# Patient Record
Sex: Female | Born: 1980 | Race: Black or African American | Hispanic: No | Marital: Single | Smoking: Never smoker
Health system: Southern US, Community
[De-identification: ages and names within clinical notes are randomized; demographics above are authoritative.]

## PROBLEM LIST (undated history)

## (undated) ENCOUNTER — Inpatient Hospital Stay (HOSPITAL_COMMUNITY): Payer: Self-pay

## (undated) DIAGNOSIS — T7840XA Allergy, unspecified, initial encounter: Secondary | ICD-10-CM

## (undated) DIAGNOSIS — J302 Other seasonal allergic rhinitis: Secondary | ICD-10-CM

## (undated) HISTORY — DX: Other seasonal allergic rhinitis: J30.2

## (undated) HISTORY — PX: FOOT SURGERY: SHX648

## (undated) HISTORY — DX: Allergy, unspecified, initial encounter: T78.40XA

---
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
## 2005-01-10 ENCOUNTER — Emergency Department (HOSPITAL_COMMUNITY): Admission: EM | Admit: 2005-01-10 | Discharge: 2005-01-10 | Payer: Self-pay | Admitting: *Deleted

## 2007-05-13 ENCOUNTER — Other Ambulatory Visit: Admission: RE | Admit: 2007-05-13 | Discharge: 2007-05-13 | Payer: Self-pay | Admitting: Obstetrics and Gynecology

## 2007-05-24 ENCOUNTER — Encounter: Admission: RE | Admit: 2007-05-24 | Discharge: 2007-05-24 | Payer: Self-pay | Admitting: Obstetrics & Gynecology

## 2010-08-25 ENCOUNTER — Encounter: Payer: Self-pay | Admitting: Obstetrics & Gynecology

## 2011-11-14 ENCOUNTER — Ambulatory Visit: Payer: BC Managed Care – PPO | Admitting: Internal Medicine

## 2011-11-14 VITALS — BP 123/76 | HR 86 | Temp 98.6°F | Resp 18 | Ht 65.75 in | Wt 205.8 lb

## 2011-11-14 DIAGNOSIS — J301 Allergic rhinitis due to pollen: Secondary | ICD-10-CM

## 2011-11-14 DIAGNOSIS — J209 Acute bronchitis, unspecified: Secondary | ICD-10-CM

## 2011-11-14 DIAGNOSIS — J9801 Acute bronchospasm: Secondary | ICD-10-CM

## 2011-11-14 MED ORDER — HYDROCODONE-HOMATROPINE 5-1.5 MG/5ML PO SYRP: 5.0000 mL | ORAL_SOLUTION | Freq: Three times a day (TID) | ORAL | Status: AC | PRN

## 2011-11-14 MED ORDER — AZITHROMYCIN 500 MG PO TABS: 500.0000 mg | ORAL_TABLET | Freq: Every day | ORAL | Status: AC

## 2011-11-14 MED ORDER — PREDNISONE 20 MG PO TABS: ORAL_TABLET | ORAL | Status: DC

## 2011-11-14 MED ORDER — FLUTICASONE PROPIONATE 50 MCG/ACT NA SUSP: 2.0000 | Freq: Every day | NASAL | Status: DC

## 2011-11-14 MED ORDER — CETIRIZINE HCL 10 MG PO TABS: 10.0000 mg | ORAL_TABLET | Freq: Every day | ORAL | Status: DC

## 2011-11-16 NOTE — Progress Notes (Signed)
  Subjective:    Patient ID: Leah Tate, female    DOB: 05/24/1981, 31 y.o.   MRN: [id number]  HPIHere with a recent history of allergic symptoms had increased to the point that she has a cough that is productive with shortness of breath when active/she has a past history of reactive airway disease without the diagnosis of asthma She has a history of allergic rhinitis    Review of SystemsThere is no fever or night sweats    Objective:   Physical Exam Vital signs are stable Conjunctiva are injected TMs clear Nares boggy Throat clear No a.c. Nodes Lungs with wheezing on forced expiration bilaterally/ mild       Assessment & Plan:  Problem #1 allergic rhinitis Problem #2 bronchitis with reactive airway disease  Treatment with Zithromax, Zyrtec, Flonase, Hycodan, and prednisone as in meds and orders

## 2012-11-28 ENCOUNTER — Ambulatory Visit: Payer: BC Managed Care – PPO | Admitting: Physician Assistant

## 2012-11-28 VITALS — BP 130/75 | HR 66 | Temp 98.5°F | Resp 16 | Ht 66.0 in | Wt 223.0 lb

## 2012-11-28 DIAGNOSIS — J309 Allergic rhinitis, unspecified: Secondary | ICD-10-CM

## 2012-11-28 DIAGNOSIS — J029 Acute pharyngitis, unspecified: Secondary | ICD-10-CM

## 2012-11-28 DIAGNOSIS — R05 Cough: Secondary | ICD-10-CM

## 2012-11-28 DIAGNOSIS — J302 Other seasonal allergic rhinitis: Secondary | ICD-10-CM | POA: Insufficient documentation

## 2012-11-28 MED ORDER — CETIRIZINE HCL 10 MG PO TABS: 10.0000 mg | ORAL_TABLET | Freq: Every day | ORAL | Status: DC

## 2012-11-28 MED ORDER — FLUTICASONE PROPIONATE 50 MCG/ACT NA SUSP: 2.0000 | Freq: Every day | NASAL | Status: DC

## 2012-11-28 MED ORDER — HYDROCODONE-ACETAMINOPHEN 7.5-325 MG/15ML PO SOLN: 5.0000 mL | Freq: Four times a day (QID) | ORAL | Status: DC | PRN

## 2012-11-28 MED ORDER — PREDNISONE 20 MG PO TABS: ORAL_TABLET | ORAL | Status: DC

## 2012-11-28 NOTE — Patient Instructions (Signed)
Get plenty of rest and drink at least 64 ounces of water daily. 

## 2012-11-28 NOTE — Progress Notes (Signed)
  Subjective:    Patient ID: Leah Tate, female    DOB: 1981/03/18, 32 y.o.   MRN: [id number]  HPI This 32 y.o. female presents for evaluation of seasonal allergies.  Sneezing, itchy eyes, sore throat and cough (productive of clear sputum). Nasal drainage is clear.  Some nausea and vomited x 1.    No fever, chills, unexplained myalgias or arthralgias .  Not currently taking anything for her symptoms, reporting that when she's tried OTC products over the years, they've not helped at all.  Has run out of Zyrtec and Flonase. Requests the same prescriptions as she got last year. The prednisone really helped, as did the cough medication.  Review of Systems As above.    Objective:   Physical Exam  Blood pressure 130/75, pulse 66, temperature 98.5 F (36.9 C), temperature source Oral, resp. rate 16, height 5\' 6"  (1.676 m), weight 223 lb (101.152 kg), last menstrual period 11/19/2012, SpO2 99.00%. Body mass index is 36.01 kg/(m^2). Well-developed, well nourished BF who is awake, alert and oriented, in NAD. HEENT: [place]/AT, PERRL, EOMI.  Sclera and conjunctiva are clear.  EAC are patent, TMs are normal in appearance. Nasal mucosa is pink and moist. OP is clear. Neck: supple, non-tender, no lymphadenopathy, thyromegaly. Heart: RRR, no murmur Lungs: normal effort, CTA Abdomen: normo-active bowel sounds, supple, non-tender, no mass or organomegaly. Extremities: no cyanosis, clubbing or edema. Skin: warm and dry without rash. Psychologic: good mood and appropriate affect, normal speech and behavior.     Assessment & Plan:  Seasonal allergic rhinitis - Plan: cetirizine (ZYRTEC) 10 MG tablet, fluticasone (FLONASE) 50 MCG/ACT nasal spray, predniSONE (DELTASONE) 20 MG tablet  Cough - Plan: HYDROcodone-acetaminophen (HYCET) 7.5-325 mg/15 ml solution  Acute pharyngitis - Plan: HYDROcodone-acetaminophen (HYCET) 7.5-325 mg/15 ml solution  Fernande Bras, PA-C Physician Assistant-Certified [facility]

## 2014-01-08 ENCOUNTER — Inpatient Hospital Stay (HOSPITAL_COMMUNITY): Payer: Self-pay

## 2014-01-08 ENCOUNTER — Encounter (HOSPITAL_COMMUNITY): Payer: Self-pay | Admitting: *Deleted

## 2014-01-08 ENCOUNTER — Inpatient Hospital Stay (HOSPITAL_COMMUNITY)
Admission: AD | Admit: 2014-01-08 | Discharge: 2014-01-08 | Disposition: A | Discharge status: Home / Self Care | Payer: Self-pay | Source: Ambulatory Visit | Attending: Obstetrics & Gynecology | Admitting: Obstetrics & Gynecology

## 2014-01-08 DIAGNOSIS — A5901 Trichomonal vulvovaginitis: Secondary | ICD-10-CM | POA: Insufficient documentation

## 2014-01-08 DIAGNOSIS — O23591 Infection of other part of genital tract in pregnancy, first trimester: Secondary | ICD-10-CM

## 2014-01-08 DIAGNOSIS — R109 Unspecified abdominal pain: Secondary | ICD-10-CM | POA: Insufficient documentation

## 2014-01-08 DIAGNOSIS — D649 Anemia, unspecified: Secondary | ICD-10-CM | POA: Insufficient documentation

## 2014-01-08 DIAGNOSIS — O98819 Other maternal infectious and parasitic diseases complicating pregnancy, unspecified trimester: Secondary | ICD-10-CM | POA: Insufficient documentation

## 2014-01-08 DIAGNOSIS — O200 Threatened abortion: Secondary | ICD-10-CM

## 2014-01-08 LAB — URINALYSIS, ROUTINE W REFLEX MICROSCOPIC
BILIRUBIN URINE: NEGATIVE
Glucose, UA: NEGATIVE mg/dL
Hgb urine dipstick: NEGATIVE
KETONES UR: 15 mg/dL — AB
NITRITE: NEGATIVE
PROTEIN: NEGATIVE mg/dL
Specific Gravity, Urine: >1.03 — ABNORMAL HIGH (ref 1.005–1.030)
Urobilinogen, UA: 0.2 mg/dL (ref 0.0–1.0)
pH: 6 (ref 5.0–8.0)

## 2014-01-08 LAB — URINE MICROSCOPIC-ADD ON

## 2014-01-08 LAB — WET PREP, GENITAL: Yeast Wet Prep HPF POC: NONE SEEN

## 2014-01-08 LAB — CBC
HCT: 31 % — ABNORMAL LOW (ref 36.0–46.0)
HEMOGLOBIN: 9.9 g/dL — AB (ref 12.0–15.0)
MCH: 27 pg (ref 26.0–34.0)
MCHC: 31.9 g/dL (ref 30.0–36.0)
MCV: 84.5 fL (ref 78.0–100.0)
PLATELETS: 332 10*3/uL (ref 150–400)
RBC: 3.67 MIL/uL — AB (ref 3.87–5.11)
RDW: 14.3 % (ref 11.5–15.5)
WBC: 8.7 10*3/uL (ref 4.0–10.5)

## 2014-01-08 LAB — HCG, QUANTITATIVE, PREGNANCY: hCG, Beta Chain, Quant, S: 6408 m[IU]/mL — ABNORMAL HIGH (ref <5)

## 2014-01-08 LAB — ABO/RH: ABO/RH(D): O POS

## 2014-01-08 LAB — POCT PREGNANCY, URINE: PREG TEST UR: POSITIVE — AB

## 2014-01-08 MED ORDER — METRONIDAZOLE 500 MG PO TABS
2000.0000 mg | ORAL_TABLET | Freq: Once | ORAL | Status: AC
  Administered 2014-01-08: 2000 mg via ORAL
  Filled 2014-01-08: qty 4

## 2014-01-08 MED ORDER — ACETAMINOPHEN 325 MG PO TABS
650.0000 mg | ORAL_TABLET | Freq: Once | ORAL | Status: AC
  Administered 2014-01-08: 650 mg via ORAL
  Filled 2014-01-08: qty 2

## 2014-01-08 NOTE — MAU Note (Signed)
Pt presents to MAU with complaints of lower abdominal cramping for two days. HPT + test last Sunday. Denies any vaginal bleeding

## 2014-01-08 NOTE — Discharge Instructions (Signed)
Anemia, Nonspecific Anemia is a condition in which the concentration of red blood cells or hemoglobin in the blood is below normal. Hemoglobin is a substance in red blood cells that carries oxygen to the tissues of the body. Anemia results in not enough oxygen reaching these tissues.  CAUSES  Common causes of anemia include:   Excessive bleeding. Bleeding may be internal or external. This includes excessive bleeding from periods (in women) or from the intestine.   Poor nutrition.   Chronic kidney, thyroid, and liver disease.  Bone marrow disorders that decrease red blood cell production.  Cancer and treatments for cancer.  HIV, AIDS, and their treatments.  Spleen problems that increase red blood cell destruction.  Blood disorders.  Excess destruction of red blood cells due to infection, medicines, and autoimmune disorders. SIGNS AND SYMPTOMS   Minor weakness.   Dizziness.   Headache.  Palpitations.   Shortness of breath, especially with exercise.   Paleness.  Cold sensitivity.  Indigestion.  Nausea.  Difficulty sleeping.  Difficulty concentrating. Symptoms may occur suddenly or they may develop slowly.  DIAGNOSIS  Additional blood tests are often needed. These help your health care provider determine the best treatment. Your health care provider will check your stool for blood and look for other causes of blood loss.  TREATMENT  Treatment varies depending on the cause of the anemia. Treatment can include:   Supplements of iron, vitamin B12, or folic acid.   Hormone medicines.   A blood transfusion. This may be needed if blood loss is severe.   Hospitalization. This may be needed if there is significant continual blood loss.   Dietary changes.  Spleen removal. HOME CARE INSTRUCTIONS Keep all follow-up appointments. It often takes many weeks to correct anemia, and having your health care provider check on your condition and your response to  treatment is very important. SEEK IMMEDIATE MEDICAL CARE IF:   You develop extreme weakness, shortness of breath, or chest pain.   You become dizzy or have trouble concentrating.  You develop heavy vaginal bleeding.   You develop a rash.   You have bloody or black, tarry stools.   You faint.   You vomit up blood.   You vomit repeatedly.   You have abdominal pain.  You have a fever or persistent symptoms for more than 2 3 days.   You have a fever and your symptoms suddenly get worse.   You are dehydrated.  MAKE SURE YOU:  Understand these instructions.  Will watch your condition.  Will get help right away if you are not doing well or get worse. Document Released: 08/28/2004 Document Revised: 03/23/2013 Document Reviewed: 01/14/2013 [facility] Patient Information 2014 [place], [place]. Threatened Miscarriage Bleeding during the first 20 weeks of pregnancy is common. This is sometimes called a threatened miscarriage. This is a pregnancy that is threatening to end before the twentieth week of pregnancy. Often this bleeding stops with bed rest or decreased activities as suggested by your caregiver and the pregnancy continues without any more problems. You may be asked to not have sexual intercourse, have orgasms or use tampons until further notice. Sometimes a threatened miscarriage can progress to a complete or incomplete miscarriage. This may or may not require further treatment. Some miscarriages occur before a woman misses a menstrual period and knows she is pregnant. Miscarriages occur in 15 to 20% of all pregnancies and usually occur during the first 13 weeks of the pregnancy. The exact cause of a miscarriage is usually  never known. A miscarriage is natures way of ending a pregnancy that is abnormal or would not make it to term. There are some things that may put you at risk to have a miscarriage, such as:  Hormone problems.  Infection of the uterus or  cervix.  Chronic illness, diabetes for example, especially if it is not controlled.  Abnormal shaped uterus.  Fibroids in the uterus.  Incompetent cervix (the cervix is too weak to hold the baby).  Smoking.  Drinking too much alcohol. It's best not to drink any alcohol when you are pregnant.  Taking illegal drugs. TREATMENT  When a miscarriage becomes complete and all products of conception (all the tissue in the uterus) have been passed, often no treatment is needed. If you think you passed tissue, save it in a container and take it to your doctor for evaluation. If the miscarriage is incomplete (parts of the fetus or placenta remain in the uterus), further treatment may be needed. The most common reason for further treatment is continued bleeding (hemorrhage) because pregnancy tissue did not pass out of the uterus. This often occurs if a miscarriage is incomplete. Tissue left behind may also become infected. Treatment usually is dilatation and curettage (the removal of the remaining products of pregnancy. This can be done by a simple sucking procedure (suction curettage) or a simple scraping of the inside of the uterus. This may be done in the hospital or in the caregiver's office. This is only done when your caregiver knows that there is no chance for the pregnancy to proceed to term. This is determined by physical examination, negative pregnancy test, falling pregnancy hormone count and/or, an ultrasound revealing a dead fetus. Miscarriages are often a very emotional time for prospective mothers and fathers. This is not you or your partners fault. It did not occur because of an inadequacy in you or your partner. Nearly all miscarriages occur because the pregnancy has started off wrongly. At least half of these pregnancies have a chromosomal abnormality. It is almost always not inherited. Others may have developmental problems with the fetus or placenta. This does not always show up even when the  products miscarried are studied under the microscope. The miscarriage is nearly always not your fault and it is not likely that you could have prevented it from happening. If you are having emotional and grieving problems, talk to your health care provider and even seek counseling, if necessary, before getting pregnant again. You can begin trying for another pregnancy as soon as your caregiver says it is OK. HOME CARE INSTRUCTIONS   Your caregiver may order bed rest depending on how much bleeding and cramping you are having. You may be limited to only getting up to go to the bathroom. You may be allowed to continue light activity. You may need to make arrangements for the care of your other children and for any other responsibilities.  Keep track of the number of pads you use each day, how often you have to change pads and how saturated (soaked) they are. Record this information.  DO NOT USE TAMPONS. Do not douche, have sexual intercourse or orgasms until approved by your caregiver.  You may receive a follow up appointment for re-evaluation of your pregnancy and a repeat blood test. Re-evaluation often occurs after 2 days and again in 4 to 6 weeks. It is very important that you follow-up in the recommended time period.  If you are Rh negative and the father is Rh positive  or you do not know the fathers' blood type, you may receive a shot (Rh immune globulin) to help prevent abnormal antibodies that can develop and affect the baby in any future pregnancies. SEEK IMMEDIATE MEDICAL CARE IF:  You have severe cramps in your stomach, back, or abdomen.  You have a sudden onset of severe pain in the lower part of your abdomen.  You develop chills.  You run an unexplained temperature of 101 F (38.3 C) or higher.  You pass large clots or tissue. Save any tissue for your caregiver to inspect.  Your bleeding increases or you become light-headed, weak, or have fainting episodes.  You have a gush of  fluid from your vagina.  You pass out. This could mean you have a tubal (ectopic) pregnancy. Document Released: 07/21/2005 Document Revised: 10/13/2011 Document Reviewed: 03/06/2008 [facility] Patient Information 2014 [place], [place]. Trichomonas Test This is a test used to diagnose an infection with Trichomonas vaginalis. This is a sexually transmitted, microscopic parasite that causes vaginal infections in women and urethritis in some men. This test is often done if there is vaginal discharge or pain on urination. If you have an infection with another sexually transmitted disease, your caregiver might test for trichomonas as well. Secretions or a sample are collected on a swab and are examined under a microscope or cultured to detect the presence of Trichomonas vaginalis. Trichomonas is one of the most common sexually transmitted diseases. An infected person is at greater risk of getting other sexually transmitted diseases, so your caregiver may want to test for these other infections also.  Trichomonas infection can affect pregnancy, contributing to premature birth and low birth weight. You should inform your physician if you may be pregnant. The doctor may medically manage a woman who is infected and in her first three months of pregnancy differently.  SPECIMEN COLLECTION In women, a swab of secretions is collected from the vagina. In men, a swab is inserted into the urethra. MEANING OF TEST  A positive test indicates an active infection that requires treatment with antibiotics. It is usually treated with an antibiotic called metronidazole. All current sexual partners must be treated at the same time or the patient is likely to become re-infected. The Center for Disease Control (CDC) recommends the following guidelines to avoid infection or reinfection:   Abstain from sexual intercourse  Use a latex condom properly, every time you have sexual intercourse, with every partner.  Limit your sexual  partners. The more sex partners you have, the greater your risk of encountering someone who has this or other STI's.  If you are infected, your sexual partner(s) should be treated. This will prevent you from getting reinfected. OBTAINING THE TEST RESULTS It is your responsibility to obtain your test results. Ask the lab or department performing the test when and how you will get your results. Document Released: 08/23/2004 Document Revised: 11/15/2012 Document Reviewed: 04/30/2005 [facility] Patient Information 2014 [place], [place].

## 2014-01-08 NOTE — MAU Provider Note (Signed)
History     CSN: [id number]  Arrival date and time: 01/08/14 1435   First Provider Initiated Contact with Patient 01/08/14 1510      Chief Complaint  Patient presents with  . Abdominal Cramping  . Possible Pregnancy   HPI Comments: Leah Tate 33 y.o. G2P0010 [redacted]w[redacted]d presents to MAU with pelvic pain and positive home preg test. She has been having cramp like pain for 2 weeks. Its a 7/10. No meds taken. No vaginal bleeding, some vaginal discharge. No partner change.  Abdominal Cramping  Possible Pregnancy Associated symptoms include abdominal pain.      Past Medical History  Diagnosis Date  . Allergic rhinitis, seasonal     [place]    Past Surgical History  Procedure Laterality Date  . Foot surgery Right     arch support    Family History  Problem Relation Age of Onset  . Hypertension Father   . Diabetes Father     History  Substance Use Topics  . Smoking status: Never Smoker   . Smokeless tobacco: Never Used  . Alcohol Use: No    Allergies: No Known Allergies  No prescriptions prior to admission    Review of Systems  Constitutional: Negative.   HENT: Negative.   Eyes: Negative.   Respiratory: Negative.   Cardiovascular: Negative.   Gastrointestinal: Positive for abdominal pain.  Genitourinary:       Vaginal discharge  Musculoskeletal: Negative.   Skin: Negative.   Neurological: Negative.   Psychiatric/Behavioral: Negative.    Physical Exam   Blood pressure 108/77, pulse 79, temperature 98.7 F (37.1 C), resp. rate 16, last menstrual period 12/02/2013.  Physical Exam  Constitutional: She is oriented to person, place, and time. She appears well-developed and well-nourished. No distress.  HENT:  Head: Normocephalic and atraumatic.  GI: Soft. She exhibits no distension. There is no tenderness. There is no rebound and no guarding.  Genitourinary:  Genital: external negative Vaginal: small amount white, odorous discharge Cervix: closed/  thick Bimanual: nontender   Musculoskeletal: Normal range of motion.  Neurological: She is alert and oriented to person, place, and time.  Skin: Skin is warm and dry.  Psychiatric: She has a normal mood and affect. Her behavior is normal. Judgment and thought content normal.   Results for orders placed during the hospital encounter of 01/08/14 (from the past 24 hour(s))  URINALYSIS, ROUTINE W REFLEX MICROSCOPIC     Status: Abnormal   Collection Time    01/08/14  2:50 PM      Result Value Ref Range   Color, Urine YELLOW  YELLOW   APPearance CLEAR  CLEAR   Specific Gravity, Urine >1.030 (*) 1.005 - 1.030   pH 6.0  5.0 - 8.0   Glucose, UA NEGATIVE  NEGATIVE mg/dL   Hgb urine dipstick NEGATIVE  NEGATIVE   Bilirubin Urine NEGATIVE  NEGATIVE   Ketones, ur 15 (*) NEGATIVE mg/dL   Protein, ur NEGATIVE  NEGATIVE mg/dL   Urobilinogen, UA 0.2  0.0 - 1.0 mg/dL   Nitrite NEGATIVE  NEGATIVE   Leukocytes, UA SMALL (*) NEGATIVE  URINE MICROSCOPIC-ADD ON     Status: Abnormal   Collection Time    01/08/14  2:50 PM      Result Value Ref Range   Squamous Epithelial / LPF MANY (*) RARE   WBC, UA 11-20  <3 WBC/hpf   RBC / HPF 3-6  <3 RBC/hpf   Bacteria, UA MANY (*) RARE   Urine-Other TRICHOMONAS  PRESENT    POCT PREGNANCY, URINE     Status: Abnormal   Collection Time    01/08/14  2:53 PM      Result Value Ref Range   Preg Test, Ur POSITIVE (*) NEGATIVE  WET PREP, GENITAL     Status: Abnormal   Collection Time    01/08/14  3:18 PM      Result Value Ref Range   Yeast Wet Prep HPF POC NONE SEEN  NONE SEEN   Trich, Wet Prep FEW (*) NONE SEEN   Clue Cells Wet Prep HPF POC FEW (*) NONE SEEN   WBC, Wet Prep HPF POC FEW (*) NONE SEEN  HCG, QUANTITATIVE, PREGNANCY     Status: Abnormal   Collection Time    01/08/14  3:28 PM      Result Value Ref Range   hCG, Beta Chain, Quant, S 6408 (*) <5 mIU/mL  CBC     Status: Abnormal   Collection Time    01/08/14  3:28 PM      Result Value Ref Range    WBC 8.7  4.0 - 10.5 K/uL   RBC 3.67 (*) 3.87 - 5.11 MIL/uL   Hemoglobin 9.9 (*) 12.0 - 15.0 g/dL   HCT 16.1 (*) 09.6 - 04.5 %   MCV 84.5  78.0 - 100.0 fL   MCH 27.0  26.0 - 34.0 pg   MCHC 31.9  30.0 - 36.0 g/dL   RDW 40.9  81.1 - 91.4 %   Platelets 332  150 - 400 K/uL  ABO/RH     Status: None   Collection Time    01/08/14  3:28 PM      Result Value Ref Range   ABO/RH(D) O POS     US Ob Comp Less 14 Wks  01/08/2014   CLINICAL DATA:  Pelvic cramping  EXAM: OBSTETRIC <14 WK [place] AND TRANSVAGINAL OB US  TECHNIQUE: Study was performed transabdominally to optimize pelvic field of view evaluation and transvaginally to optimize internal visceral architecture evaluation.  COMPARISON:  None.  FINDINGS: Intrauterine gestational sac: Visualized/normal in shape. A single sac is seen.  Yolk sac:  Not seen  Embryo:  Not seen  MSD:  8  mm   5 w   3  d  [place] EDC: September 07, 2014  Maternal uterus/adnexae: Within the maternal uterus, there is a hypoechoic mass in the fundus measuring 1.9 x 1.9 x 1.3 cm. No subchorionic hemorrhage seen. Maternal left ovary could not be visualized. The right maternal ovary appears unremarkable. There is no free pelvic fluid.  IMPRESSION: A single gestational sac is seen within the uterus. Fetal pole not seen at this time. Probable early intrauterine gestational sac, but no yolk sac, fetal pole, or cardiac activity yet visualized. Recommend follow-up quantitative B-HCG levels and follow-up US in 14 days to confirm and assess viability. This recommendation follows SRU consensus guidelines: Diagnostic Criteria for Nonviable Pregnancy Early in the First Trimester. Malva Limes Med 2013; 782:9562-13.  There is a small intrauterine leiomyoma. Maternal left ovary could not be visualized. There is no extrauterine maternal pelvic mass or fluid seen.   Electronically Signed   By: Bretta Bang M.D.   On: 01/08/2014 16:46   US Ob Transvaginal  01/08/2014   CLINICAL DATA:  Pelvic cramping  EXAM:  OBSTETRIC <14 WK [place] AND TRANSVAGINAL OB US  TECHNIQUE: Study was performed transabdominally to optimize pelvic field of view evaluation and transvaginally to optimize internal visceral architecture  evaluation.  COMPARISON:  None.  FINDINGS: Intrauterine gestational sac: Visualized/normal in shape. A single sac is seen.  Yolk sac:  Not seen  Embryo:  Not seen  MSD:  8  mm   5 w   3  d  US EDC: September 07, 2014  Maternal uterus/adnexae: Within the maternal uterus, there is a hypoechoic mass in the fundus measuring 1.9 x 1.9 x 1.3 cm. No subchorionic hemorrhage seen. Maternal left ovary could not be visualized. The right maternal ovary appears unremarkable. There is no free pelvic fluid.  IMPRESSION: A single gestational sac is seen within the uterus. Fetal pole not seen at this time. Probable early intrauterine gestational sac, but no yolk sac, fetal pole, or cardiac activity yet visualized. Recommend follow-up quantitative B-HCG levels and follow-up US in 14 days to confirm and assess viability. This recommendation follows SRU consensus guidelines: Diagnostic Criteria for Nonviable Pregnancy Early in the First Trimester. Malva Limes Engl J Med 2013; 846:9629-52; 369:1443-51.  There is a small intrauterine leiomyoma. Maternal left ovary could not be visualized. There is no extrauterine maternal pelvic mass or fluid seen.   Electronically Signed   By: Bretta BangWilliam  Woodruff M.D.   On: 01/08/2014 16:46    MAU Course  Procedures  MDM Wet prep, GC, Chlamydia, CBC, UA, U/S, ABORh, Quant Tylenol/ she is driving  Assessment and Plan   A: Pain in early pregnancy Trichomonas Anemia  P: Flagyl 2 Grams po now Miscarriage precautions Repeat Quant in 2 days Advised to start PNV today Advised to increase iron in diet [facility] Trichomonas  Doralee AlbinoLinda M Tali Coster 01/08/2014, 3:24 PM

## 2014-01-09 LAB — GC/CHLAMYDIA PROBE AMP
CT Probe RNA: NEGATIVE
GC Probe RNA: NEGATIVE

## 2014-01-09 LAB — URINE CULTURE: Colony Count: 30000

## 2014-01-10 NOTE — Progress Notes (Addendum)
Urine culture results: Consulted with Delores (PharmD) regarding urine results and what to treat patient with.  Advised to consider doing a cath UA to rule out contaminants.  If repeat + for DIPHTHEROIDS(CORYNEBACTERIUM SPECIES), treat with amp and gent.   Pt called and notified, unable to come to [facility] during day due to job.  Will come to [facility]U on Thursday for cath UA.  Does not need to see provider.

## 2014-06-05 ENCOUNTER — Encounter (HOSPITAL_COMMUNITY): Payer: Self-pay | Admitting: *Deleted

## 2014-11-13 ENCOUNTER — Encounter (HOSPITAL_COMMUNITY): Payer: Self-pay | Admitting: *Deleted

## 2015-01-04 ENCOUNTER — Ambulatory Visit (INDEPENDENT_AMBULATORY_CARE_PROVIDER_SITE_OTHER): Payer: BLUE CROSS/BLUE SHIELD | Admitting: Physician Assistant

## 2015-01-04 VITALS — BP 122/70 | HR 88 | Temp 99.1°F | Resp 18 | Ht 66.0 in | Wt 250.0 lb

## 2015-01-04 DIAGNOSIS — J029 Acute pharyngitis, unspecified: Secondary | ICD-10-CM | POA: Diagnosis not present

## 2015-01-04 DIAGNOSIS — F329 Major depressive disorder, single episode, unspecified: Secondary | ICD-10-CM

## 2015-01-04 DIAGNOSIS — R635 Abnormal weight gain: Secondary | ICD-10-CM

## 2015-01-04 DIAGNOSIS — F32A Depression, unspecified: Secondary | ICD-10-CM

## 2015-01-04 DIAGNOSIS — R059 Cough, unspecified: Secondary | ICD-10-CM

## 2015-01-04 DIAGNOSIS — R05 Cough: Secondary | ICD-10-CM | POA: Diagnosis not present

## 2015-01-04 LAB — POCT CBC
Granulocyte percent: 75.8 %G (ref 37–80)
HEMATOCRIT: 32.4 % — AB (ref 37.7–47.9)
HEMOGLOBIN: 10 g/dL — AB (ref 12.2–16.2)
Lymph, poc: 2 (ref 0.6–3.4)
MCH, POC: 25.4 pg — AB (ref 27–31.2)
MCHC: 30.9 g/dL — AB (ref 31.8–35.4)
MCV: 82 fL (ref 80–97)
MID (CBC): 0.5 (ref 0–0.9)
MPV: 6.6 fL (ref 0–99.8)
POC GRANULOCYTE: 7.9 — AB (ref 2–6.9)
POC LYMPH %: 19 % (ref 10–50)
POC MID %: 5.2 % (ref 0–12)
Platelet Count, POC: 365 10*3/uL (ref 142–424)
RBC: 3.95 M/uL — AB (ref 4.04–5.48)
RDW, POC: 15.1 %
WBC: 10.4 10*3/uL — AB (ref 4.6–10.2)

## 2015-01-04 LAB — POCT RAPID STREP A (OFFICE): Rapid Strep A Screen: NEGATIVE

## 2015-01-04 MED ORDER — HYDROCOD POLST-CPM POLST ER 10-8 MG/5ML PO SUER: 5.0000 mL | Freq: Two times a day (BID) | ORAL | Status: DC | PRN

## 2015-01-04 MED ORDER — MAGIC MOUTHWASH W/LIDOCAINE: 10.0000 mL | ORAL | Status: DC | PRN

## 2015-01-04 MED ORDER — AZITHROMYCIN 250 MG PO TABS: ORAL_TABLET | ORAL | Status: DC

## 2015-01-04 NOTE — Progress Notes (Signed)
[facility] [address], [place] [address] [phone]  Date:  01/04/2015   Name:  Leah Tate   DOB:  1980-12-26   MRN:  [id number]  PCP:  No PCP Per Patient    Chief Complaint: Cough; Sore Throat; and Nasal Congestion   History of Present Illness:  This is a 34 y.o. female with PMH allergic rhinitis who is presenting with 1 week of cough, sore throat and nasal congestion. Reports 2 days prior to URI symptoms she had a "stomach bug" and had vomiting and diarrhea. Diarrhea is better but still vomiting at least once a day. Vomiting now is more d/t post-tussive emesis. Having sore throat and states it's painful to swallow. Cough is dry. She's having constant headache located to upper forehead. Having post-nasal drip. Has problems with allergies in March/April but hasn't had problems since then. Been taking theraflu and zycam and not working. Not taking an allergy pill. Denies fever, chills, SOB, wheezing  Pt notes that she has had depressed mood since high school. It has been worse over the past 2 years d/t situational stressors. A romantic relationship ended 1 year ago after she got pregnant and had an abortion. She has never been on any medications. She states she took care of her father in high school and he was on a lot of medications which makes her wary of taking anything long-term. She has tried therapy before and was helpful but she is not in a place financially for therapy now. She works as a Haematologistbank teller. She aspires to go to beauty school and [place] She states she wants to move and "start over". She denies SI/HI. She does not exercise but states in the past when her mood has been better she loved exercising. She has gained 45 pounds in the past 3 years.  Review of Systems:  Review of Systems  Constitutional: Negative for fever and chills.  HENT: Positive for congestion, sore throat and voice change. Negative for ear pain.   Eyes: Negative for  redness.  Respiratory: Positive for cough. Negative for shortness of breath and wheezing.   Cardiovascular: Negative for chest pain.  Gastrointestinal: Positive for nausea and vomiting. Negative for abdominal pain.  Skin: Negative for rash.  Allergic/Immunologic: Positive for environmental allergies.  Neurological: Positive for headaches.  Hematological: Negative for adenopathy.  Psychiatric/Behavioral: Positive for sleep disturbance.    Patient Active Problem List   Diagnosis Date Noted  . Seasonal allergic rhinitis 11/28/2012    Prior to Admission medications   Not on File    No Known Allergies  Past Surgical History  Procedure Laterality Date  . Foot surgery Right     arch support    History  Substance Use Topics  . Smoking status: Never Smoker   . Smokeless tobacco: Never Used  . Alcohol Use: No    Family History  Problem Relation Age of Onset  . Hypertension Father   . Diabetes Father     Medication list has been reviewed and updated.  Physical Examination:  Physical Exam  Constitutional: She is oriented to person, place, and time. She appears well-developed and well-nourished. No distress.  HENT:  Head: Normocephalic and atraumatic.  Right Ear: Hearing, tympanic membrane, external ear and ear canal normal.  Left Ear: Hearing, tympanic membrane, external ear and ear canal normal.  Nose: Mucosal edema present.  Mouth/Throat: Uvula is midline and mucous membranes are normal. Posterior oropharyngeal edema (bilateral tonsils 2-3+)  present. No oropharyngeal exudate, posterior oropharyngeal erythema or tonsillar abscesses.  Eyes: Conjunctivae and lids are normal. Right eye exhibits no discharge. Left eye exhibits no discharge. No scleral icterus.  Cardiovascular: Normal rate, regular rhythm, normal heart sounds and normal pulses.   No murmur heard. Pulmonary/Chest: Effort normal and breath sounds normal. No respiratory distress. She has no wheezes. She has no  rhonchi. She has no rales.  Abdominal: Soft. Normal appearance. There is no tenderness.  Musculoskeletal: Normal range of motion.  Lymphadenopathy:       Head (right side): No submental, no submandibular and no tonsillar adenopathy present.       Head (left side): No submental, no submandibular and no tonsillar adenopathy present.    She has no cervical adenopathy.  Neurological: She is alert and oriented to person, place, and time.  Skin: Skin is warm, dry and intact. No lesion and no rash noted.  Psychiatric: She has a normal mood and affect. Her speech is normal and behavior is normal. Thought content normal.   BP 122/70 mmHg  Pulse 88  Temp(Src) 99.1 F (37.3 C) (Oral)  Resp 18  Ht  (1.676 m)  Wt 250 lb (113.399 kg)  BMI 40.37 kg/m2  SpO2 98%  LMP 12/18/2014  Results for orders placed or performed in visit on 01/04/15  POCT rapid strep A  Result Value Ref Range   Rapid Strep A Screen Negative Negative  POCT CBC  Result Value Ref Range   WBC 10.4 (A) 4.6 - 10.2 K/uL   Lymph, poc 2.0 0.6 - 3.4   POC LYMPH PERCENT 19.0 10 - 50 %L   MID (cbc) 0.5 0 - 0.9   POC MID % 5.2 0 - 12 %M   POC Granulocyte 7.9 (A) 2 - 6.9   Granulocyte percent 75.8 37 - 80 %G   RBC 3.95 (A) 4.04 - 5.48 M/uL   Hemoglobin 10.0 (A) 12.2 - 16.2 g/dL   HCT, POC 04.5 (A) 40.9 - 47.9 %   MCV 82.0 80 - 97 fL   MCH, POC 25.4 (A) 27 - 31.2 pg   MCHC 30.9 (A) 31.8 - 35.4 g/dL   RDW, POC 81.1 %   Platelet Count, POC 365.0 142 - 424 K/uL   MPV 6.6 0 - 99.8 fL   Assessment and Plan:  1. Cough 2. Sore throat 3. Acute pharyngitis CBC with wbc slightly above normal and mild left shift. Rapid strep negative, culture pending. Zpak prescribed. Mouthwash and cough syrup for symptoms. She will return if symptoms not improving in 7-10 days. - POCT CBC - POCT rapid strep A - Alum & Mag Hydroxide-Simeth (MAGIC MOUTHWASH W/LIDOCAINE) SOLN; Take 10 mLs by mouth every 2 (two) hours as needed for mouth pain.   Dispense: 360 mL; Refill: 0 - Culture, Group A Strep - azithromycin (ZITHROMAX) 250 MG tablet; Take 2 tabs PO x 1 dose, then 1 tab PO QD x 4 days  Dispense: 6 tablet; Refill: 0 - chlorpheniramine-HYDROcodone (TUSSIONEX PENNKINETIC ER) 10-8 MG/5ML SUER; Take 5 mLs by mouth every 12 (twelve) hours as needed for cough.  Dispense: 100 mL; Refill: 0 - Culture, Group A Strep  4. Depression 5. Weight gain Pt has had depressed mood for over a decade and never taken anything since she is wary of medication. Depression has been worse than normal for the past 2 years. She has gained 45 pounds in the past 3 years d/t lack of motivation for exercise. She is not  having SI/HI. Pt does not want to start anything at this time but I strongly encouraged her to think about it. Advised restarting exercise for weight loss and mood purposes. - TSH   [place] Miners. Dyke Brackett, MHS [facility]  01/04/2015

## 2015-01-04 NOTE — Patient Instructions (Signed)
Take antibiotic until finished. Use cough syrup at night for sleep. Use mouthwash for throat pain. If you are not getting better in 7-10 days, return to clinic. If your mood is not improving, let me know, we can start you on a medication. Start exercising - this will naturally improve your mood.

## 2015-01-05 LAB — CULTURE, GROUP A STREP: ORGANISM ID, BACTERIA: NORMAL

## 2015-01-05 LAB — TSH: TSH: 0.562 u[IU]/mL (ref 0.350–4.500)

## 2017-01-26 ENCOUNTER — Emergency Department (HOSPITAL_BASED_OUTPATIENT_CLINIC_OR_DEPARTMENT_OTHER): Payer: Self-pay

## 2017-01-26 ENCOUNTER — Encounter (HOSPITAL_BASED_OUTPATIENT_CLINIC_OR_DEPARTMENT_OTHER): Payer: Self-pay | Admitting: Emergency Medicine

## 2017-01-26 ENCOUNTER — Emergency Department (HOSPITAL_BASED_OUTPATIENT_CLINIC_OR_DEPARTMENT_OTHER)
Admission: EM | Admit: 2017-01-26 | Discharge: 2017-01-26 | Disposition: A | Discharge status: Home / Self Care | Payer: Self-pay | Attending: Emergency Medicine | Admitting: Emergency Medicine

## 2017-01-26 DIAGNOSIS — Y999 Unspecified external cause status: Secondary | ICD-10-CM | POA: Insufficient documentation

## 2017-01-26 DIAGNOSIS — R05 Cough: Secondary | ICD-10-CM | POA: Insufficient documentation

## 2017-01-26 DIAGNOSIS — Y929 Unspecified place or not applicable: Secondary | ICD-10-CM | POA: Insufficient documentation

## 2017-01-26 DIAGNOSIS — W1789XA Other fall from one level to another, initial encounter: Secondary | ICD-10-CM | POA: Insufficient documentation

## 2017-01-26 DIAGNOSIS — M25562 Pain in left knee: Secondary | ICD-10-CM | POA: Insufficient documentation

## 2017-01-26 DIAGNOSIS — Y9389 Activity, other specified: Secondary | ICD-10-CM | POA: Insufficient documentation

## 2017-01-26 DIAGNOSIS — R059 Cough, unspecified: Secondary | ICD-10-CM

## 2017-01-26 MED ORDER — ALBUTEROL SULFATE HFA 108 (90 BASE) MCG/ACT IN AERS
1.0000 | INHALATION_SPRAY | Freq: Once | RESPIRATORY_TRACT | Status: AC
  Administered 2017-01-26: 2 via RESPIRATORY_TRACT
  Filled 2017-01-26: qty 6.7

## 2017-01-26 MED ORDER — IBUPROFEN 800 MG PO TABS: 800.0000 mg | ORAL_TABLET | Freq: Three times a day (TID) | ORAL | 0 refills | Status: DC | PRN

## 2017-01-26 NOTE — ED Notes (Signed)
Patient transported to X-ray 

## 2017-01-26 NOTE — ED Triage Notes (Signed)
Pt has been having a non-productive cough for over one month.  Pt seen at urgent care x 2 but has not improved.  Pt fell Friday off a chair.  Pt having left knee pain.

## 2017-01-26 NOTE — Discharge Instructions (Signed)
It was my pleasure taking care of you today!   Ibuprofen as needed for knee pain. Rest and ice affected area for additional pain relief. Please follow-up with your regular doctor if needed pain does not improve over the next week. If you do not have a primary care provider, please see the resource guide attached to discharge summary.  Begin taking Claritin or Zyrtec daily for cough/allergies. Use inhaler every 4 hours as needed for cough. Follow up with primary care provider if symptoms are not improving in the next 2 weeks.  Return to the emergency department for new or worsening symptoms, any additional concerns.

## 2017-01-26 NOTE — ED Provider Notes (Signed)
[facility]   CSN: [id number] Arrival date & time: 01/26/17  0944     History   Chief Complaint Chief Complaint  Patient presents with  . Cough  . Leg Pain    HPI Leah Tate is a 36 y.o. female.  The history is provided by the patient and medical records. No language interpreter was used.  Cough   Leg Pain     Leah Tate is a 36 y.o. female  with a PMH of seasonal allergies who presents to the Emergency Department with two complaints:  1. Nonproductive cough 2 months. She was seen by urgent care at the end of April where she had a negative chest x-ray and was given [employer] Perles. These did not help. She has also tried Delsym over-the-counter as well as cough drops with no relief. She went back to the urgent care a second time where she was given a steroid dose pack. This improved her congestion and sinus pressure, however dry cough has persisted. She then went to the urgent care a third time where she was given an inhaler to use for cough. Unfortunately this was too expensive and she did not get it filled. She no longer is experiencing congestion or sore throat. No fever or chills. No trouble breathing or chest pain. No neck pain.  2. Acute onset of aching, throbbing left knee pain 2-3 days. Patient states that she got on a chair in order to kill a spider on the ceiling, when she fell from the stair, hitting the top of her knee. Patient states she has been able to ambulate, although with a limp. She has not tried any medication or treatment for her symptoms. No history of injuries to the left lower extremity in the past. No open wounds.  Past Medical History:  Diagnosis Date  . Allergic rhinitis, seasonal    Springtime  . Allergy     Patient Active Problem List   Diagnosis Date Noted  . Seasonal allergic rhinitis 11/28/2012    Past Surgical History:  Procedure Laterality Date  . FOOT SURGERY Right    arch support    OB History    Gravida Para Term Preterm AB Living   2 0 0 0 1 0   SAB TAB Ectopic Multiple Live Births   0 1 0 0         Home Medications    Prior to Admission medications   Medication Sig Start Date End Date Taking? Authorizing Provider  ibuprofen (ADVIL,MOTRIN) 800 MG tablet Take 1 tablet (800 mg total) by mouth every 8 (eight) hours as needed for moderate pain. 01/26/17   Saylor Murry, Chase PicketJaime Pilcher, PA-C    Family History Family History  Problem Relation Age of Onset  . Hypertension Father   . Diabetes Father     Social History Social History  Substance Use Topics  . Smoking status: Never Smoker  . Smokeless tobacco: Never Used  . Alcohol use No     Allergies   Patient has no known allergies.   Review of Systems Review of Systems  Respiratory: Positive for cough.   Musculoskeletal: Positive for arthralgias. Negative for back pain.  All other systems reviewed and are negative.    Physical Exam Updated Vital Signs BP (!) 126/94   Pulse 74   Temp 98.7 F (37.1 C) (Oral)   Resp 18   Ht 5\' 6"  (1.676 m)   Wt 117.9 kg (260 lb)   LMP  01/12/2017   SpO2 100%   BMI 41.97 kg/m   Physical Exam  Constitutional: She is oriented to person, place, and time. She appears well-developed and well-nourished. No distress.  HENT:  Head: Normocephalic and atraumatic.  OP with erythema, no exudates or tonsillar hypertrophy. No nasal mucosal edema. No focal areas of sinus tenderness.  Neck: Normal range of motion. Neck supple.  Cardiovascular: Normal rate, regular rhythm and normal heart sounds.   Pulmonary/Chest: Effort normal.  Lungs are clear to auscultation bilaterally - no w/r/r  Abdominal: Soft. She exhibits no distension. There is no tenderness.  Musculoskeletal:  Left knee with diffuse tenderness over anterior knee. Full ROM. No joint line tenderness. No joint effusion or swelling appreciated. No abnormal alignment or patellar mobility. No bruising, erythema or warmth overlaying the  joint. No varus/valgus laxity. Negative drawer's, Lachman's and McMurray's.  No crepitus. 2+ DP pulses bilaterally. All compartments are soft. Sensation intact distal to injury.  Neurological: She is alert and oriented to person, place, and time.  Skin: Skin is warm and dry. She is not diaphoretic.  Nursing note and vitals reviewed.    ED Treatments / Results  Labs (all labs ordered are listed, but only abnormal results are displayed) Labs Reviewed - No data to display  EKG  EKG Interpretation None       Radiology Dg Knee Complete 4 Views Left  Result Date: 01/26/2017 CLINICAL DATA:  Patient states that she caught her foot in a chair and hyperextended her left knee on 01/23/17, has left anterior knee pains, walking with a limp, no other complaints EXAM: LEFT KNEE - COMPLETE 4+ VIEW COMPARISON:  None. FINDINGS: No evidence of fracture, dislocation, or joint effusion. No evidence of arthropathy or other focal bone abnormality. Soft tissues are unremarkable. IMPRESSION: Negative. Electronically Signed   By: Bary Richard M.D.   On: 01/26/2017 11:20    Procedures Procedures (including critical care time)  Medications Ordered in ED Medications  albuterol (PROVENTIL HFA;VENTOLIN HFA) 108 (90 Base) MCG/ACT inhaler 1-2 puff (2 puffs Inhalation Given 01/26/17 1131)     Initial Impression / Assessment and Plan / ED Course  I have reviewed the triage vital signs and the nursing notes.  Pertinent labs & imaging results that were available during my care of the patient were reviewed by me and considered in my medical decision making (see chart for details).    Leah Tate is a 36 y.o. female who presents to ED for two complaints:  1. Cough x 2 months. Already had CXR at urgent care which was negative. On exam, patient is very well appearing, afebrile with clear lungs bilaterally. No coughing noted throughout examination. Do not feel repeating a chest x-ray would be of value. Associated  sore throat, congestion, sinus pressure have resolved. Likely residual cough from viral URI. She was given rx for inhaler to use as needed for cough at urgent care but was unable to get prescription filled due to cost. Inhaler provided here. PCP follow up if symptoms persist another week or two.   2. Left knee pain 2/2 mechanical fall. LLE NVI. X-ray negative. Symptomatic home care instructions discussed. PCP follow up in 1 week with no improvement. Ace wrap provided for comfort. Crutches offered but patient declined.   Return precautions discussed and all questions answered.    Final Clinical Impressions(s) / ED Diagnoses   Final diagnoses:  Cough  Acute pain of left knee    New Prescriptions New Prescriptions  IBUPROFEN (ADVIL,MOTRIN) 800 MG TABLET    Take 1 tablet (800 mg total) by mouth every 8 (eight) hours as needed for moderate pain.     Sadik Piascik, Chase Picket, PA-C 01/26/17 1200    Tegeler, Canary Brim, MD 01/26/17 [phone]

## 2018-01-09 ENCOUNTER — Encounter: Payer: Self-pay | Admitting: Physician Assistant

## 2018-01-09 ENCOUNTER — Ambulatory Visit (INDEPENDENT_AMBULATORY_CARE_PROVIDER_SITE_OTHER): Payer: 59 | Admitting: Physician Assistant

## 2018-01-09 VITALS — BP 134/81 | HR 66 | Temp 98.8°F | Resp 16 | Ht 66.0 in | Wt 280.0 lb

## 2018-01-09 DIAGNOSIS — J209 Acute bronchitis, unspecified: Secondary | ICD-10-CM | POA: Diagnosis not present

## 2018-01-09 LAB — GLUCOSE, POCT (MANUAL RESULT ENTRY): POC Glucose: 118 mg/dl — AB (ref 70–99)

## 2018-01-09 MED ORDER — HYDROCOD POLST-CPM POLST ER 10-8 MG/5ML PO SUER: 5.0000 mL | Freq: Two times a day (BID) | ORAL | 0 refills | Status: DC | PRN

## 2018-01-09 MED ORDER — PREDNISONE 10 MG PO TABS: ORAL_TABLET | ORAL | 0 refills | Status: DC

## 2018-01-09 NOTE — Progress Notes (Signed)
MRN: [id number] DOB: May 08, 1981  Subjective:   Leah Tate is a 37 y.o. female presenting for chief complaint of Establish Care; Cough; and Emesis .  Reports 1 month history of dry hacking cough. Has been coughing so hard that she vomits. Has lost her voice a little. Has tried tessalon perles, hycodan, amoxicillin, and a zpack with no full relief. Has been seen in [facility] 3 times in the past month. Was dx with pneumonia in left lung?  But then went back and was told she had bronchitis. She has not been able to go back to work because she talks on the phone all day and cannot stop coughing and now has hoarse voice. Denies fever, sinus congestion, sinus pain, rhinorrhea, itchy watery eyes, sore throat, shortness of breath and chest pain, night sweats, nausea, abdominal pain and diarrhea. Has not had sick contact with anyone. Has history of seasonal allergies, takes OTC antihistamine. No history of asthma. Patient has had flu shot this season. Denies smoking. Denies any other aggravating or relieving factors, no other questions or concerns.   ROS per HPI  Leah Tate has a current medication list which includes the following prescription(s): albuterol, benzonatate, hydrocodone-homatropine, ibuprofen, promethazine, chlorpheniramine-hydrocodone, and prednisone. Also has No Known Allergies.  Leah Tate  has a past medical history of Allergic rhinitis, seasonal and Allergy. Also  has a past surgical history that includes Foot surgery (Right).    Social History   Socioeconomic History  . Marital status: Single    Spouse name: n/a  . Number of children: 0  . Years of education: 2116  . Highest education level: Not on file  Occupational History  . Occupation: CUSTOMER SERVICE    Employer: [employer]  Social Needs  . Financial resource strain: Not on file  . Food insecurity:    Worry: Not on file    Inability: Not on file  . Transportation needs:    Medical: Not on file    Non-medical:  Not on file  Tobacco Use  . Smoking status: Never Smoker  . Smokeless tobacco: Never Used  Substance and Sexual Activity  . Alcohol use: No  . Drug use: No  . Sexual activity: Yes    Partners: Male    Birth control/protection: Condom  Lifestyle  . Physical activity:    Days per week: Not on file    Minutes per session: Not on file  . Stress: Not on file  Relationships  . Social connections:    Talks on phone: Not on file    Gets together: Not on file    Attends religious service: Not on file    Active member of club or organization: Not on file    Attends meetings of clubs or organizations: Not on file    Relationship status: Not on file  . Intimate partner violence:    Fear of current or ex partner: Not on file    Emotionally abused: Not on file    Physically abused: Not on file    Forced sexual activity: Not on file  Other Topics Concern  . Not on file  Social History Narrative   Lives with her mother at present.    Objective:   Vitals: BP 134/81   Pulse 66   Temp 98.8 F (37.1 C) (Oral)   Resp 16   Ht 5\' 6"  (1.676 m)   Wt 280 lb (127 kg)   SpO2 100%   BMI 45.19 kg/m   Physical  Exam  Constitutional: She is oriented to person, place, and time. She appears well-developed and well-nourished. No distress.  HENT:  Head: Normocephalic and atraumatic.  Right Ear: Tympanic membrane, external ear and ear canal normal.  Left Ear: Tympanic membrane, external ear and ear canal normal.  Nose: Mucosal edema present. Right sinus exhibits no maxillary sinus tenderness and no frontal sinus tenderness. Left sinus exhibits no maxillary sinus tenderness and no frontal sinus tenderness.  Mouth/Throat: Uvula is midline and mucous membranes are normal. No posterior oropharyngeal edema, posterior oropharyngeal erythema or tonsillar abscesses. No tonsillar exudate.  Mild hoarse voice noted.   Eyes: Conjunctivae are normal.  Neck: Normal range of motion.  Cardiovascular: Normal  rate, regular rhythm, normal heart sounds and intact distal pulses.  Pulmonary/Chest: Effort normal and breath sounds normal. She has no decreased breath sounds. She has no wheezes. She has no rhonchi. She has no rales. She exhibits tenderness (with palpation of anterior costal cartilage bilaterally).  Lymphadenopathy:       Head (right side): No submental, no submandibular, no tonsillar, no preauricular, no posterior auricular and no occipital adenopathy present.       Head (left side): No submental, no submandibular, no tonsillar, no preauricular, no posterior auricular and no occipital adenopathy present.    She has no cervical adenopathy.       Right: No supraclavicular adenopathy present.       Left: No supraclavicular adenopathy present.  Neurological: She is alert and oriented to person, place, and time.  Skin: Skin is warm and dry.  Psychiatric: She has a normal mood and affect.  Vitals reviewed.   Results for orders placed or performed in visit on 01/09/18 (from the past 24 hour(s))  POCT glucose (manual entry)     Status: Abnormal   Collection Time: 01/09/18  2:23 PM  Result Value Ref Range   POC Glucose 118 (A) 70 - 99 mg/dl    Assessment and Plan :  1. Acute bronchitis, unspecified organism - Likely viral in etiology d/t reassuring physical exam findings. Pt has already received two rounds of abx. She is well appearing, NAD. Vitals stable. Lungs CTAB. Due to job and sx, can attempt trial of oral prednisone for sx relief.  - Advised supportive care, offered symptomatic relief. - Return to clinic if symptoms worsen or fail to improve in 2 weeks, otherwise return to clinic as needed. - POCT glucose (manual entry) - chlorpheniramine-HYDROcodone (TUSSIONEX PENNKINETIC ER) 10-8 MG/5ML SUER; Take 5 mLs by mouth every 12 (twelve) hours as needed for cough.  Dispense: 50 mL; Refill: 0 - predniSONE (DELTASONE) 10 MG tablet; 4-4-2-2-1-1 taper. Take all the tablets for that day in the am  with food.  Dispense: 14 tablet; Refill: 0  Side effects, risks, benefits, and alternatives of the medications and treatment plan prescribed today were discussed, and patient expressed understanding of the instructions given. No barriers to understanding were identified. Red flags discussed in detail. Pt expressed understanding regarding what to do in case of emergency/urgent symptoms.   Benjiman Core, PA-C  [facility] at [facility] 01/09/2018 2:29 PM

## 2018-01-09 NOTE — Patient Instructions (Signed)
-   We will treat this as a respiratory viral infection. We are going to treat your underlying inflammation with oral prednisone. Prednisone is a steroid and can cause side effects such as headache, irritability, nausea, vomiting, increased heart rate, increased blood pressure, increased blood sugar, appetite changes, and insomnia. Please take tablets in the morning with a full meal to help decrease the chances of these side effects.  - I recommend you rest, drink plenty of fluids, eat light meals including soups.  - You may use cough syrup at night for your cough. Be aware that cough syrup can definitely make you drowsy and sleepy so do not drive or operate any heavy machinery if it is affecting you during the day.  - Tea recipe for cough: boil water, add 2 inches shaved ginger root, steep 15 minutes, add juice from 2 full lemons, and 2 tbsp honey. - Please let me know if you are not seeing any improvement or get worse in 2 weeks.   Acute Bronchitis, Adult Acute bronchitis is when air tubes (bronchi) in the lungs suddenly get swollen. The condition can make it hard to breathe. It can also cause these symptoms:  A cough.  Coughing up clear, yellow, or green mucus.  Wheezing.  Chest congestion.  Shortness of breath.  A fever.  Body aches.  Chills.  A sore throat.  Follow these instructions at home: Medicines  Take over-the-counter and prescription medicines only as told by your doctor.  If you were prescribed an antibiotic medicine, take it as told by your doctor. Do not stop taking the antibiotic even if you start to feel better. General instructions  Rest.  Drink enough fluids to keep your pee (urine) clear or pale yellow.  Avoid smoking and secondhand smoke. If you smoke and you need help quitting, ask your doctor. Quitting will help your lungs heal faster.  Use an inhaler, cool mist vaporizer, or humidifier as told by your doctor.  Keep all follow-up visits as told by  your doctor. This is important. How is this prevented? To lower your risk of getting this condition again:  Wash your hands often with soap and water. If you cannot use soap and water, use hand sanitizer.  Avoid contact with people who have cold symptoms.  Try not to touch your hands to your mouth, nose, or eyes.  Make sure to get the flu shot every year.  Contact a doctor if:  Your symptoms do not get better in 2 weeks. Get help right away if:  You cough up blood.  You have chest pain.  You have very bad shortness of breath.  You become dehydrated.  You faint (pass out) or keep feeling like you are going to pass out.  You keep throwing up (vomiting).  You have a very bad headache.  Your fever or chills gets worse. This information is not intended to replace advice given to you by your health care provider. Make sure you discuss any questions you have with your health care provider. Document Released: 01/07/2008 Document Revised: 02/27/2016 Document Reviewed: 01/09/2016 Elsevier Interactive Patient Education  [employer]2018 [employer]

## 2019-02-14 IMAGING — CR DG KNEE COMPLETE 4+V*L*
4 series · 4 of 4 positions shown · non-contrast
Comparison: None.

CLINICAL DATA: Patient states that she caught her foot in a chair
and hyperextended her left knee on 01/23/17, has left anterior knee
pains, walking with a limp, no other complaints

EXAM:
LEFT KNEE - COMPLETE 4+ VIEW

[t knee ap left *]
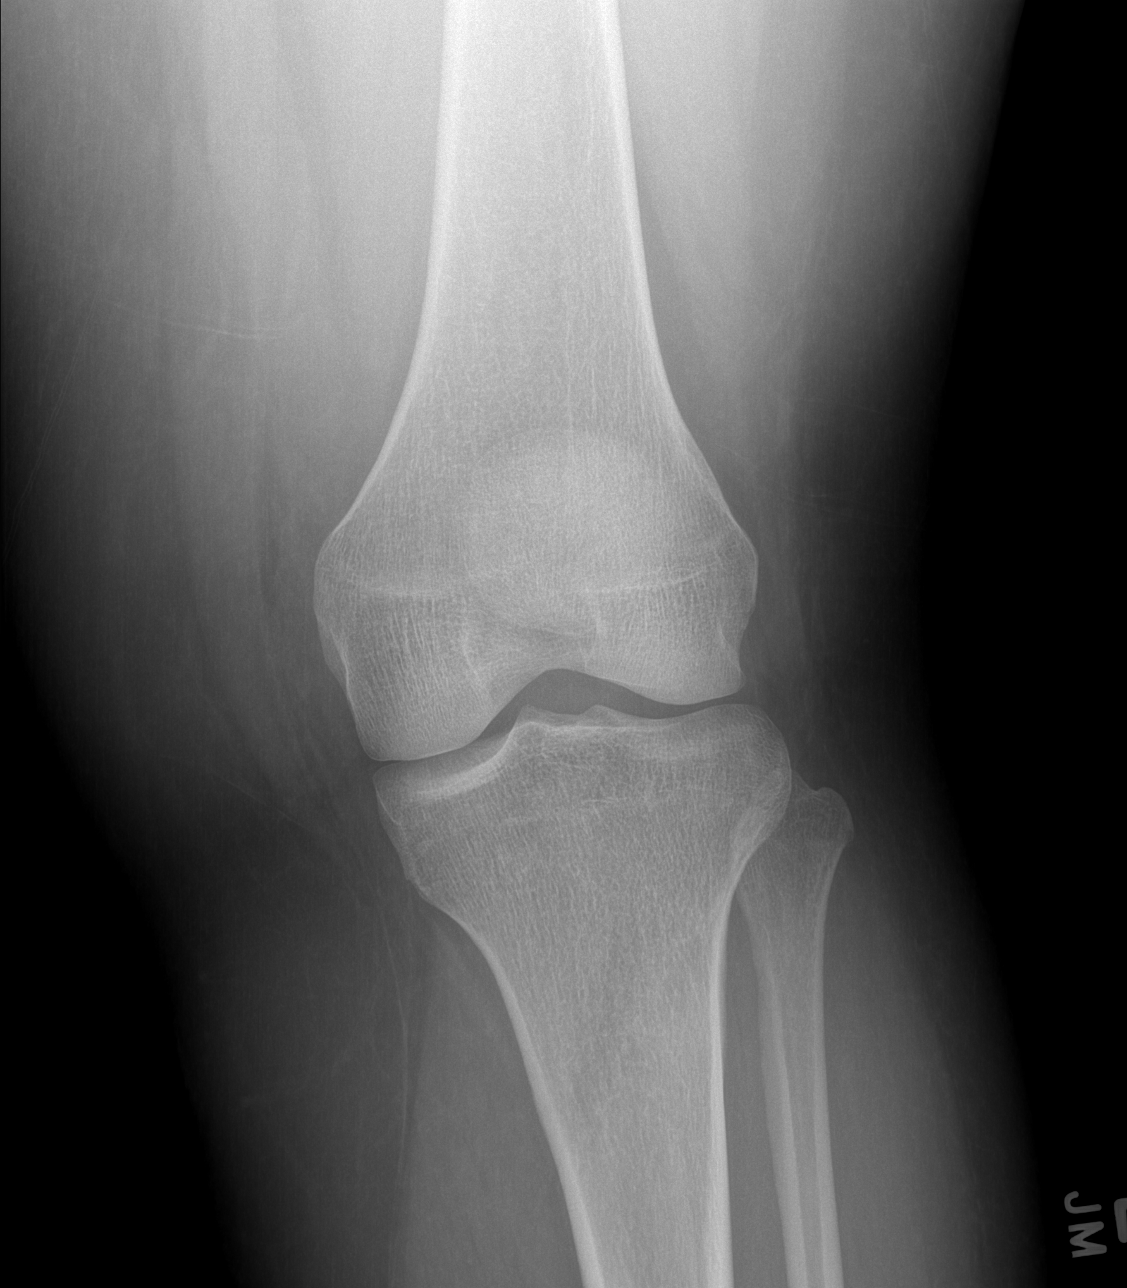

[t knee oblique left *]
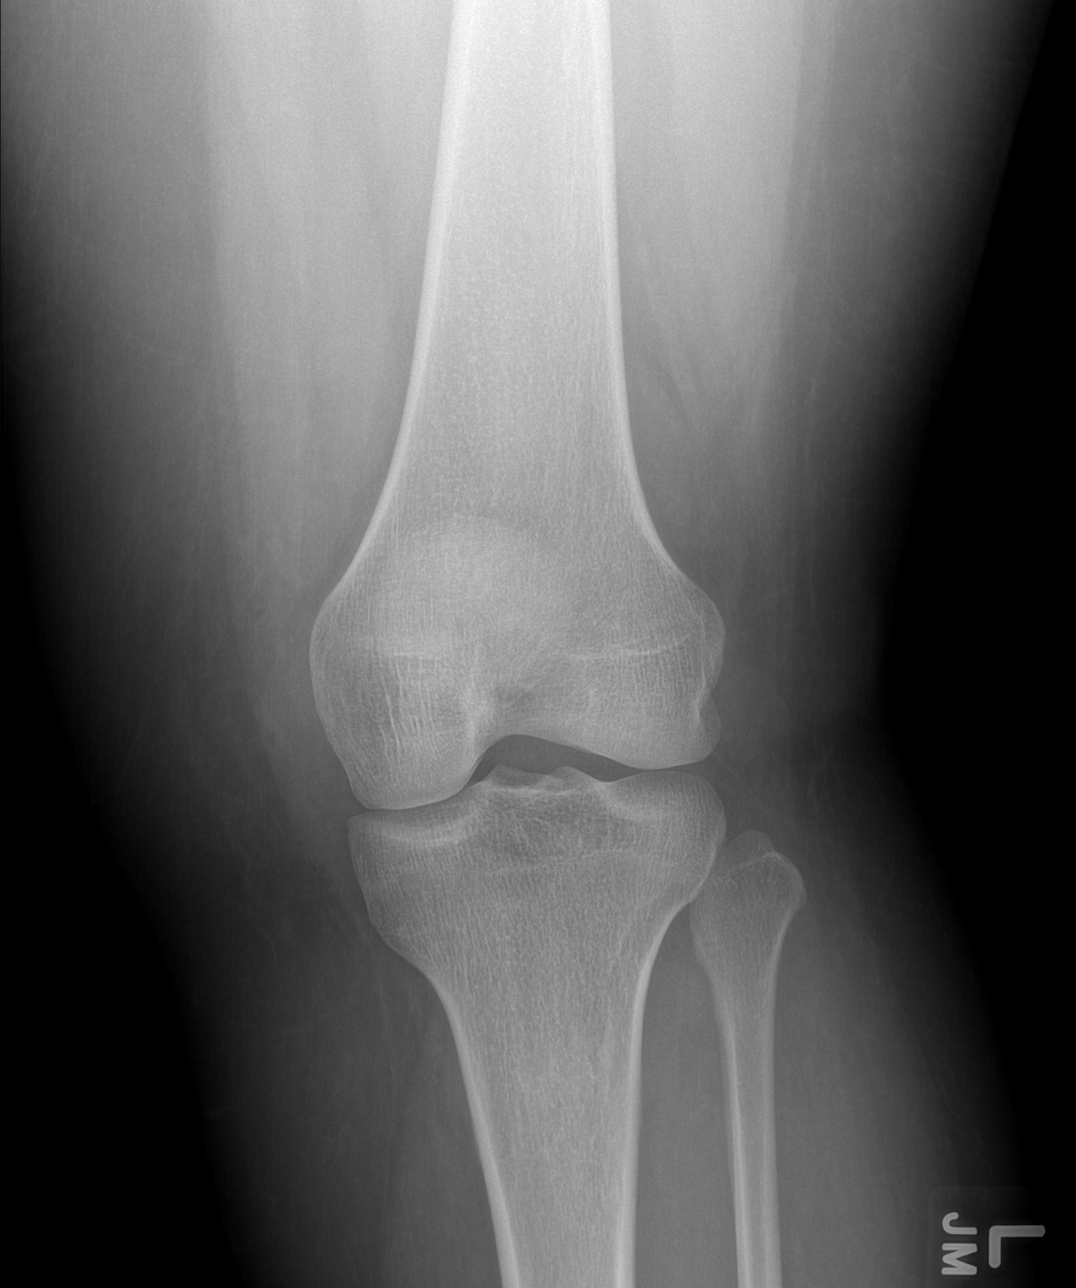

[t knee oblique left]
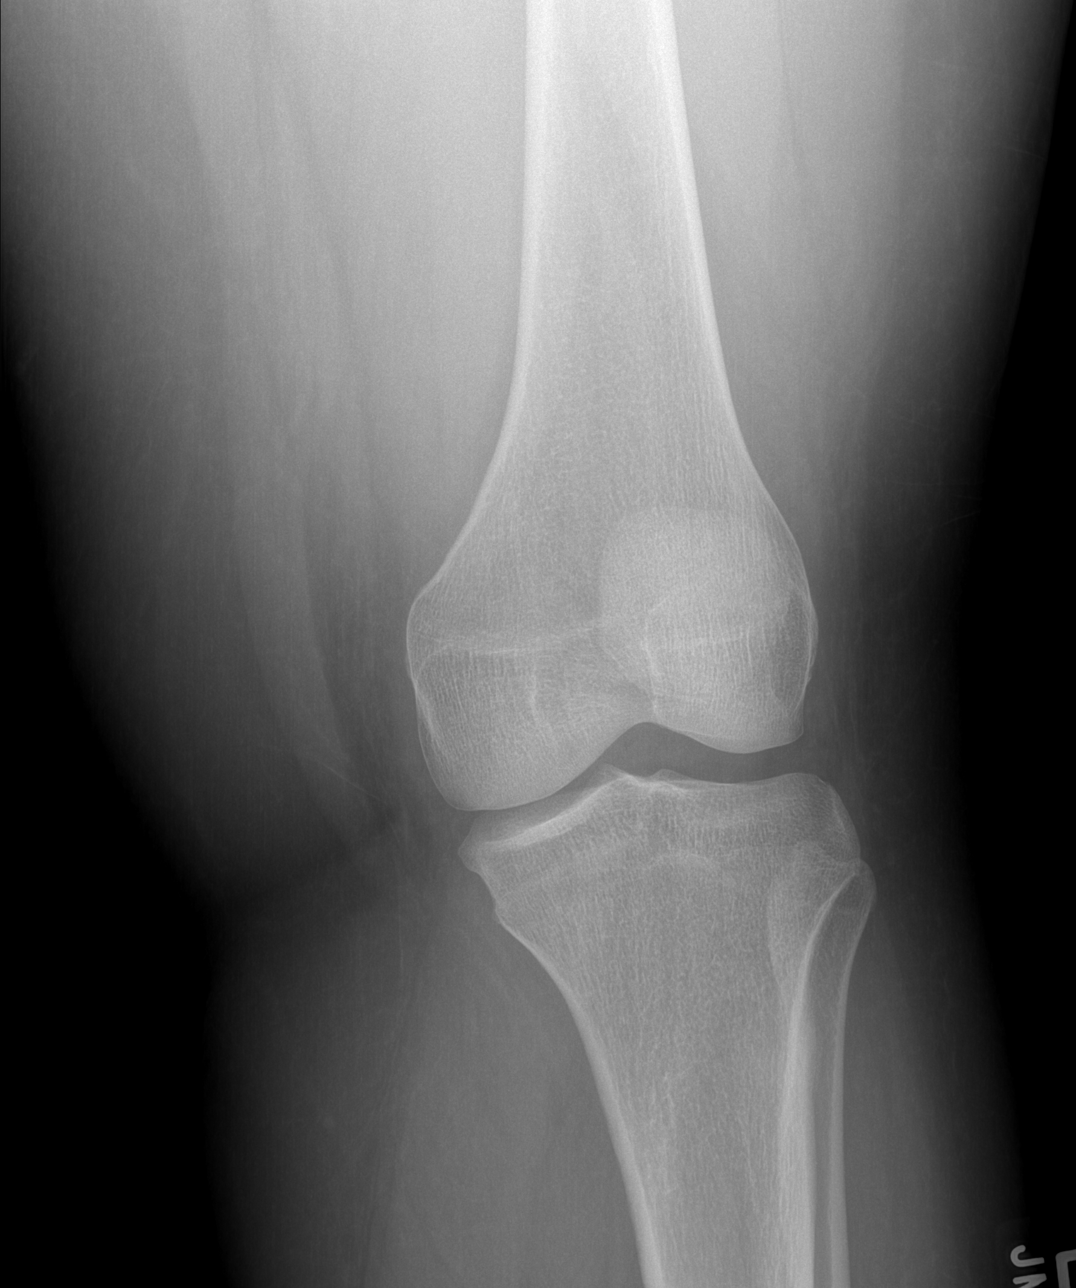

[t knee lat left *]
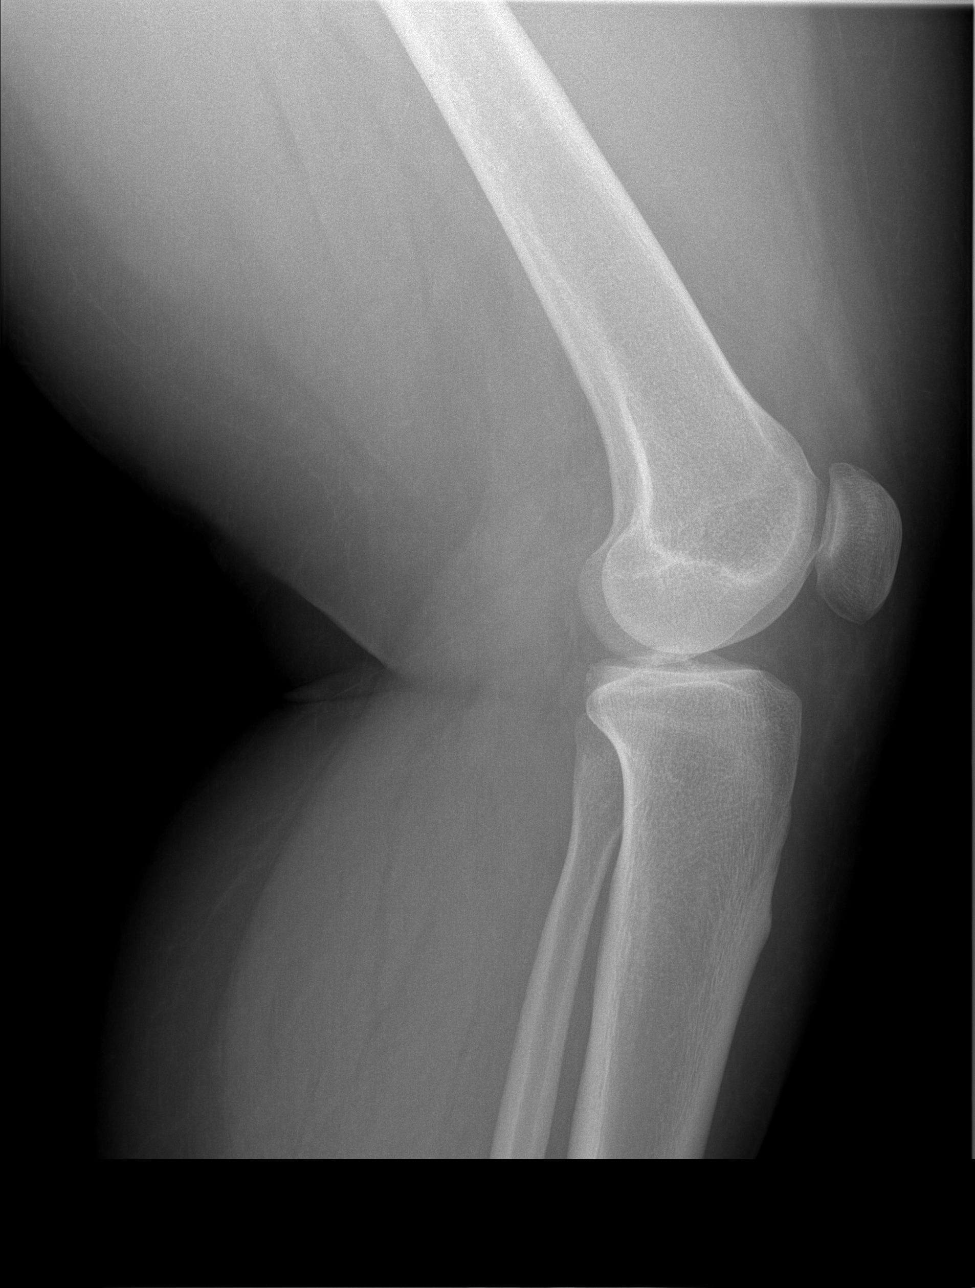

[4 of 4 positions shown; findings below may reference images not displayed]

FINDINGS: No evidence of fracture, dislocation, or joint effusion. No evidence
of arthropathy or other focal bone abnormality. Soft tissues are
unremarkable.
IMPRESSION: Negative.

## 2019-11-26 ENCOUNTER — Ambulatory Visit: Payer: Self-pay | Attending: Internal Medicine

## 2019-11-26 DIAGNOSIS — Z23 Encounter for immunization: Secondary | ICD-10-CM

## 2019-11-26 NOTE — Progress Notes (Signed)
   Covid-19 Vaccination Clinic  Name:  Leah Tate    MRN: [id number] DOB: 10-Sep-1980  11/26/2019  [name] was observed post Covid-19 immunization for 15 minutes without incident. She was provided with Vaccine Information Sheet and instruction to access the V-Safe system.   [name] was instructed to call 911 with any severe reactions post vaccine: Marland Kitchen Difficulty breathing  . Swelling of face and throat  . A fast heartbeat  . A bad rash all over body  . Dizziness and weakness   Immunizations Administered    Name Date Dose VIS Date Route   Pfizer COVID-19 Vaccine 11/26/2019  8:19 AM 0.3 mL 09/28/2018 Intramuscular   Manufacturer: [employer], [employer]   Lot: [id number]   NDC: [address]-4

## 2019-12-19 ENCOUNTER — Ambulatory Visit: Payer: Self-pay | Attending: Internal Medicine

## 2019-12-19 DIAGNOSIS — Z23 Encounter for immunization: Secondary | ICD-10-CM

## 2019-12-19 NOTE — Progress Notes (Signed)
   Covid-19 Vaccination Clinic  Name:  BECCA BAYNE    MRN: [id number] DOB: 03/06/1981  12/19/2019  Ms. Birkhead was observed post Covid-19 immunization for 15 minutes without incident. She was provided with Vaccine Information Sheet and instruction to access the V-Safe system.   Ms. Strauch was instructed to call 911 with any severe reactions post vaccine: Marland Kitchen Difficulty breathing  . Swelling of face and throat  . A fast heartbeat  . A bad rash all over body  . Dizziness and weakness   Immunizations Administered    Name Date Dose VIS Date Route   Pfizer COVID-19 Vaccine 12/19/2019  8:23 AM 0.3 mL 09/28/2018 Intramuscular   Manufacturer: [employer], [employer]   Lot: [id number]   NDC: [address]-1

## 2021-07-03 ENCOUNTER — Other Ambulatory Visit: Payer: Self-pay

## 2021-07-03 ENCOUNTER — Encounter (HOSPITAL_COMMUNITY): Payer: Self-pay

## 2021-07-03 ENCOUNTER — Ambulatory Visit (INDEPENDENT_AMBULATORY_CARE_PROVIDER_SITE_OTHER): Payer: Self-pay

## 2021-07-03 ENCOUNTER — Ambulatory Visit (HOSPITAL_COMMUNITY)
Admission: EM | Admit: 2021-07-03 | Discharge: 2021-07-03 | Disposition: A | Discharge status: Home / Self Care | Payer: Self-pay | Attending: Urgent Care | Admitting: Urgent Care

## 2021-07-03 DIAGNOSIS — R0989 Other specified symptoms and signs involving the circulatory and respiratory systems: Secondary | ICD-10-CM

## 2021-07-03 DIAGNOSIS — R053 Chronic cough: Secondary | ICD-10-CM

## 2021-07-03 DIAGNOSIS — R0602 Shortness of breath: Secondary | ICD-10-CM

## 2021-07-03 DIAGNOSIS — Z8701 Personal history of pneumonia (recurrent): Secondary | ICD-10-CM

## 2021-07-03 DIAGNOSIS — R059 Cough, unspecified: Secondary | ICD-10-CM

## 2021-07-03 MED ORDER — PREDNISONE 10 MG PO TABS: 30.0000 mg | ORAL_TABLET | Freq: Every day | ORAL | 0 refills | Status: AC

## 2021-07-03 MED ORDER — PROMETHAZINE-DM 6.25-15 MG/5ML PO SYRP: 5.0000 mL | ORAL_SOLUTION | Freq: Every evening | ORAL | 0 refills | Status: AC | PRN

## 2021-07-03 MED ORDER — ALBUTEROL SULFATE HFA 108 (90 BASE) MCG/ACT IN AERS: 1.0000 | INHALATION_SPRAY | Freq: Four times a day (QID) | RESPIRATORY_TRACT | 0 refills | Status: AC | PRN

## 2021-07-03 MED ORDER — BENZONATATE 100 MG PO CAPS: 100.0000 mg | ORAL_CAPSULE | Freq: Three times a day (TID) | ORAL | 0 refills | Status: AC | PRN

## 2021-07-03 NOTE — ED Triage Notes (Signed)
Pt presents with non productive cough, congestion and shortness of breath X 3 weeks.

## 2021-07-03 NOTE — ED Provider Notes (Signed)
[facility]   MRN: [id number] DOB: 02/18/1981  Subjective:   Leah Tate is a 40 y.o. female presenting for 3 week history of persistent cough, chest congestion, shob. Has also had runny and stuffy nose. No fevers.  No history of asthma.  Patient is not a smoker.  She does have a history of allergic rhinitis but does not take anything consistently for this.  She is concerned about pneumonia, has previously had this and reports that they found her through an x-ray, is requesting this today.  No current facility-administered medications for this encounter.  Current Outpatient Medications:    albuterol (PROVENTIL HFA;VENTOLIN HFA) 108 (90 Base) MCG/ACT inhaler, , Disp: , Rfl:    benzonatate (TESSALON) 100 MG capsule, TAKE 1-2 CAPSULES BY MOUTH EVERY 8 HOURS AS NEEDED FOR COUGH, Disp: , Rfl: 0   chlorpheniramine-HYDROcodone (TUSSIONEX PENNKINETIC ER) 10-8 MG/5ML SUER, Take 5 mLs by mouth every 12 (twelve) hours as needed for cough., Disp: 50 mL, Rfl: 0   HYDROcodone-homatropine (HYCODAN) 5-1.5 MG/5ML syrup, TAKE 5 MLS EVERY 8 HOURS AS NEEDED FOR COUGH, Disp: , Rfl: 0   ibuprofen (ADVIL,MOTRIN) 800 MG tablet, Take 1 tablet (800 mg total) by mouth every 8 (eight) hours as needed for moderate pain., Disp: 21 tablet, Rfl: 0   predniSONE (DELTASONE) 10 MG tablet, 4-4-2-2-1-1 taper. Take all the tablets for that day in the am with food., Disp: 14 tablet, Rfl: 0   promethazine (PHENERGAN) 12.5 MG tablet, 1 TABLET BY MOUTH EVERY 6 HOURS AS NEEDED FOR NAUSEA/VOMITING - MAY CAUSE DROWSINESS, Disp: , Rfl: 0   No Known Allergies  Past Medical History:  Diagnosis Date   Allergic rhinitis, seasonal    Springtime   Allergy      Past Surgical History:  Procedure Laterality Date   FOOT SURGERY Right    arch support    Family History  Problem Relation Age of Onset   Hypertension Father    Diabetes Father     Social History   Tobacco Use   Smoking status: Never    Smokeless tobacco: Never  Vaping Use   Vaping Use: Never used  Substance Use Topics   Alcohol use: No   Drug use: No    ROS   Objective:   Vitals: BP (!) 155/78 (BP Location: Right Arm)   Pulse 73   Temp 98.3 F (36.8 C) (Oral)   Resp 17   LMP 06/27/2021   SpO2 98%   Physical Exam Constitutional:      General: She is not in acute distress.    Appearance: Normal appearance. She is well-developed. She is obese. She is not ill-appearing, toxic-appearing or diaphoretic.  HENT:     Head: Normocephalic and atraumatic.     Nose: Nose normal.     Mouth/Throat:     Mouth: Mucous membranes are moist.  Eyes:     Extraocular Movements: Extraocular movements intact.     Pupils: Pupils are equal, round, and reactive to light.  Cardiovascular:     Rate and Rhythm: Normal rate and regular rhythm.     Pulses: Normal pulses.     Heart sounds: Normal heart sounds. No murmur heard.   No friction rub. No gallop.  Pulmonary:     Effort: Pulmonary effort is normal. No respiratory distress.     Breath sounds: Normal breath sounds. No stridor. No wheezing, rhonchi or rales.  Skin:    General: Skin is warm and dry.  Findings: No rash.  Neurological:     Mental Status: She is alert and oriented to person, place, and time.  Psychiatric:        Mood and Affect: Mood normal.        Behavior: Behavior normal.        Thought Content: Thought content normal.    DG Chest 2 View  Result Date: 07/03/2021 CLINICAL DATA:  Cough, chest congestion and shortness of breath. EXAM: CHEST - 2 VIEW COMPARISON:  None. FINDINGS: Two views of the chest demonstrate slightly decreased lung volumes. No focal airspace disease and no overt pulmonary edema. Heart and mediastinum are within normal limits. Trachea is midline. No pleural effusions. No acute bone abnormality. IMPRESSION: Slightly decreased lung volumes without acute findings. Electronically Signed   By: Richarda Overlie M.D.   On: 07/03/2021 11:07      Assessment and Plan :   PDMP not reviewed this encounter.  1. Persistent cough   2. Chest congestion   3. History of pneumonia    Recommended an oral prednisone course for patient given her persistent coughing, respiratory symptoms. Counseled patient on potential for adverse effects with medications prescribed/recommended today, ER and return-to-clinic precautions discussed, patient verbalized understanding.    Wallis Bamberg, PA-C 07/03/21 1113

## 2022-01-31 DIAGNOSIS — L309 Dermatitis, unspecified: Secondary | ICD-10-CM | POA: Diagnosis not present

## 2022-01-31 DIAGNOSIS — L308 Other specified dermatitis: Secondary | ICD-10-CM | POA: Diagnosis not present

## 2022-01-31 DIAGNOSIS — L700 Acne vulgaris: Secondary | ICD-10-CM | POA: Diagnosis not present

## 2022-01-31 DIAGNOSIS — L2089 Other atopic dermatitis: Secondary | ICD-10-CM | POA: Diagnosis not present

## 2022-03-14 DIAGNOSIS — L810 Postinflammatory hyperpigmentation: Secondary | ICD-10-CM | POA: Diagnosis not present

## 2022-03-14 DIAGNOSIS — L309 Dermatitis, unspecified: Secondary | ICD-10-CM | POA: Diagnosis not present

## 2022-03-14 DIAGNOSIS — L308 Other specified dermatitis: Secondary | ICD-10-CM | POA: Diagnosis not present

## 2022-07-20 DIAGNOSIS — Z111 Encounter for screening for respiratory tuberculosis: Secondary | ICD-10-CM | POA: Diagnosis not present

## 2022-07-20 DIAGNOSIS — Z23 Encounter for immunization: Secondary | ICD-10-CM | POA: Diagnosis not present

## 2022-07-22 DIAGNOSIS — Z0279 Encounter for issue of other medical certificate: Secondary | ICD-10-CM | POA: Diagnosis not present

## 2022-08-10 DIAGNOSIS — Z6841 Body Mass Index (BMI) 40.0 and over, adult: Secondary | ICD-10-CM | POA: Diagnosis not present

## 2022-08-10 DIAGNOSIS — J0100 Acute maxillary sinusitis, unspecified: Secondary | ICD-10-CM | POA: Diagnosis not present

## 2022-08-10 DIAGNOSIS — R051 Acute cough: Secondary | ICD-10-CM | POA: Diagnosis not present

## 2022-08-10 DIAGNOSIS — R030 Elevated blood-pressure reading, without diagnosis of hypertension: Secondary | ICD-10-CM | POA: Diagnosis not present

## 2022-09-08 DIAGNOSIS — G4700 Insomnia, unspecified: Secondary | ICD-10-CM | POA: Diagnosis not present

## 2022-09-08 DIAGNOSIS — Z1159 Encounter for screening for other viral diseases: Secondary | ICD-10-CM | POA: Diagnosis not present

## 2022-09-08 DIAGNOSIS — J069 Acute upper respiratory infection, unspecified: Secondary | ICD-10-CM | POA: Diagnosis not present

## 2022-09-08 DIAGNOSIS — J40 Bronchitis, not specified as acute or chronic: Secondary | ICD-10-CM | POA: Diagnosis not present

## 2022-09-08 DIAGNOSIS — R030 Elevated blood-pressure reading, without diagnosis of hypertension: Secondary | ICD-10-CM | POA: Diagnosis not present

## 2022-09-12 DIAGNOSIS — J309 Allergic rhinitis, unspecified: Secondary | ICD-10-CM | POA: Diagnosis not present

## 2022-09-12 DIAGNOSIS — R030 Elevated blood-pressure reading, without diagnosis of hypertension: Secondary | ICD-10-CM | POA: Diagnosis not present

## 2022-09-12 DIAGNOSIS — Z114 Encounter for screening for human immunodeficiency virus [HIV]: Secondary | ICD-10-CM | POA: Diagnosis not present

## 2022-09-12 DIAGNOSIS — J40 Bronchitis, not specified as acute or chronic: Secondary | ICD-10-CM | POA: Diagnosis not present

## 2022-09-12 DIAGNOSIS — Z0000 Encounter for general adult medical examination without abnormal findings: Secondary | ICD-10-CM | POA: Diagnosis not present

## 2023-01-14 ENCOUNTER — Emergency Department (HOSPITAL_BASED_OUTPATIENT_CLINIC_OR_DEPARTMENT_OTHER)
Admission: EM | Admit: 2023-01-14 | Discharge: 2023-01-14 | Disposition: A | Discharge status: Home / Self Care | Payer: BC Managed Care – PPO | Attending: Emergency Medicine | Admitting: Emergency Medicine

## 2023-01-14 ENCOUNTER — Other Ambulatory Visit: Payer: Self-pay

## 2023-01-14 ENCOUNTER — Emergency Department (HOSPITAL_BASED_OUTPATIENT_CLINIC_OR_DEPARTMENT_OTHER): Payer: BC Managed Care – PPO | Admitting: Radiology

## 2023-01-14 ENCOUNTER — Encounter (HOSPITAL_BASED_OUTPATIENT_CLINIC_OR_DEPARTMENT_OTHER): Payer: Self-pay | Admitting: Emergency Medicine

## 2023-01-14 DIAGNOSIS — D649 Anemia, unspecified: Secondary | ICD-10-CM | POA: Insufficient documentation

## 2023-01-14 DIAGNOSIS — R072 Precordial pain: Secondary | ICD-10-CM | POA: Diagnosis not present

## 2023-01-14 DIAGNOSIS — F439 Reaction to severe stress, unspecified: Secondary | ICD-10-CM | POA: Insufficient documentation

## 2023-01-14 DIAGNOSIS — R0789 Other chest pain: Secondary | ICD-10-CM

## 2023-01-14 LAB — BASIC METABOLIC PANEL
Anion gap: 7 (ref 5–15)
BUN: 7 mg/dL (ref 6–20)
CO2: 24 mmol/L (ref 22–32)
Calcium: 9.1 mg/dL (ref 8.9–10.3)
Chloride: 106 mmol/L (ref 98–111)
Creatinine, Ser: 0.85 mg/dL (ref 0.44–1.00)
GFR, Estimated: >60 mL/min (ref >60)
Glucose, Bld: 95 mg/dL (ref 70–99)
Potassium: 3.5 mmol/L (ref 3.5–5.1)
Sodium: 137 mmol/L (ref 135–145)

## 2023-01-14 LAB — CBC
HCT: 31.9 % — ABNORMAL LOW (ref 36.0–46.0)
Hemoglobin: 10.1 g/dL — ABNORMAL LOW (ref 12.0–15.0)
MCH: 25.8 pg — ABNORMAL LOW (ref 26.0–34.0)
MCHC: 31.7 g/dL (ref 30.0–36.0)
MCV: 81.4 fL (ref 80.0–100.0)
Platelets: 327 10*3/uL (ref 150–400)
RBC: 3.92 MIL/uL (ref 3.87–5.11)
RDW: 14.5 % (ref 11.5–15.5)
WBC: 7.9 10*3/uL (ref 4.0–10.5)
nRBC: 0 % (ref 0.0–0.2)

## 2023-01-14 LAB — TROPONIN I (HIGH SENSITIVITY): Troponin I (High Sensitivity): 2 ng/L (ref <18)

## 2023-01-14 NOTE — ED Triage Notes (Signed)
Pt here from home with c/o chest pain off and on for over a year mostly center of her chest non radiating , no n/v or sob

## 2023-01-14 NOTE — ED Provider Notes (Signed)
[place] [facility] AT [facility] Provider Note   CSN: [id number] Arrival date & time: 01/14/23  0815     History  Chief Complaint  Patient presents with   Chest Pain    Leah Tate is a 42 y.o. female.  With a history of seasonal rhinitis who presents to the ED for evaluation of chest pain.  She states she has had intermittent chest pains over the past year after starting a stressful job.  Pain is localized to the substernal region.  Described as a throbbing sensation.  It does not radiate.  No associated nausea, vomiting or diaphoresis.  No cardiac history.  Pain is not exertional.  No associated shortness of breath, hemoptysis, unilateral leg swelling, history of DVT or PE, estrogen use.  She states that she started to feel the pain when she got to work this morning so a nurse checked her blood pressure and found it to be "180" systolic and was encouraged to come to the ED for further evaluation.  States she has had elevated blood pressure readings in the past but does not take any antihypertensives.  She says she is under a lot of stress at work and typically does not even have time to take a lunch break which causes her to eat poorly including a lot of potato chips and sodas.   Chest Pain      Home Medications Prior to Admission medications   Medication Sig Start Date End Date Taking? Authorizing Provider  albuterol (VENTOLIN HFA) 108 (90 Base) MCG/ACT inhaler Inhale 1-2 puffs into the lungs every 6 (six) hours as needed for wheezing or shortness of breath. 07/03/21   Wallis Bamberg, PA-C  benzonatate (TESSALON) 100 MG capsule Take 1-2 capsules (100-200 mg total) by mouth 3 (three) times daily as needed for cough. 07/03/21   Wallis Bamberg, PA-C  predniSONE (DELTASONE) 10 MG tablet Take 3 tablets (30 mg total) by mouth daily with breakfast. 07/03/21   Wallis Bamberg, PA-C  promethazine-dextromethorphan (PROMETHAZINE-DM) 6.25-15 MG/5ML syrup Take 5 mLs by mouth at  bedtime as needed for cough. 07/03/21   Wallis Bamberg, PA-C      Allergies    Patient has no known allergies.    Review of Systems   Review of Systems  Cardiovascular:  Positive for chest pain.  All other systems reviewed and are negative.   Physical Exam Updated Vital Signs BP 124/76   Pulse (!) 56   Temp 98.2 F (36.8 C) (Oral)   Resp 15   Ht 5\' 6"  (1.676 m)   Wt 127 kg   SpO2 100%   BMI 45.19 kg/m  Physical Exam Vitals and nursing note reviewed.  Constitutional:      General: She is not in acute distress.    Appearance: She is obese. She is not ill-appearing, toxic-appearing or diaphoretic.     Comments: Resting comfortably in bed  HENT:     Head: Normocephalic and atraumatic.  Eyes:     Conjunctiva/sclera: Conjunctivae normal.  Cardiovascular:     Rate and Rhythm: Normal rate and regular rhythm.     Pulses:          Radial pulses are 2+ on the right side and 2+ on the left side.     Heart sounds: No murmur heard. Pulmonary:     Effort: Pulmonary effort is normal. No respiratory distress.     Breath sounds: Normal breath sounds. No decreased breath sounds, wheezing, rhonchi or rales.  Abdominal:  Palpations: Abdomen is soft.     Tenderness: There is no abdominal tenderness.  Musculoskeletal:        General: No swelling.     Cervical back: Neck supple.     Right lower leg: No edema.     Left lower leg: No edema.  Skin:    General: Skin is warm and dry.     Capillary Refill: Capillary refill takes less than 2 seconds.  Neurological:     General: No focal deficit present.     Mental Status: She is alert and oriented to person, place, and time.  Psychiatric:        Mood and Affect: Mood normal.     ED Results / Procedures / Treatments   Labs (all labs ordered are listed, but only abnormal results are displayed) Labs Reviewed  CBC - Abnormal; Notable for the following components:      Result Value   Hemoglobin 10.1 (*)    HCT 31.9 (*)    MCH 25.8  (*)    All other components within normal limits  BASIC METABOLIC PANEL  TROPONIN I (HIGH SENSITIVITY)    EKG EKG Interpretation  Date/Time:  Wednesday January 14 2023 08:27:18 EDT Ventricular Rate:  59 PR Interval:  178 QRS Duration: 90 QT Interval:  417 QTC Calculation: 414 R Axis:   39 Text Interpretation: Sinus bradycardia No previous ECGs available Confirmed by Ernie Avena ([id number]) on 01/14/2023 9:35:00 AM  Radiology DG Chest 2 View  Result Date: 01/14/2023 CLINICAL DATA:  Chest pain EXAM: CHEST - 2 VIEW COMPARISON:  CXR 07/03/21 FINDINGS: The heart size and mediastinal contours are within normal limits. Both lungs are clear. The visualized skeletal structures are unremarkable. IMPRESSION: No focal airspace opacity. Electronically Signed   By: Lorenza Cambridge M.D.   On: 01/14/2023 09:23    Procedures Procedures    Medications Ordered in ED Medications - No data to display  ED Course/ Medical Decision Making/ A&P             HEART Score: 1                Medical Decision Making Amount and/or Complexity of Data Reviewed Labs: ordered. Radiology: ordered.  This patient presents to the ED for concern of chest pain, this involves an extensive number of treatment options, and is a complaint that carries with it a high risk of complications and morbidity.  The emergent differential diagnosis of chest pain includes: Acute coronary syndrome, pericarditis, aortic dissection, pulmonary embolism, tension pneumothorax, and esophageal rupture.  I do not believe the patient has an emergent cause of chest pain, other urgent/non-acute considerations include, but are not limited to: chronic angina, aortic stenosis, cardiomyopathy, myocarditis, mitral valve prolapse, pulmonary hypertension, hypertrophic obstructive cardiomyopathy (HOCM), aortic insufficiency, right ventricular hypertrophy, pneumonia, pleuritis, bronchitis, pneumothorax, tumor, gastroesophageal reflux disease (GERD), esophageal  spasm, Mallory-Weiss syndrome, peptic ulcer disease, biliary disease, pancreatitis, functional gastrointestinal pain, cervical or thoracic disk disease or arthritis, shoulder arthritis, costochondritis, subacromial bursitis, anxiety or panic attack, herpes zoster, breast disorders, chest wall tumors, thoracic outlet syndrome, mediastinitis.  My initial workup includes ACS rule out  Additional history obtained from: Nursing notes from this visit.  I ordered, reviewed and interpreted labs which include: CBC, BMP, troponin.  Stable anemia with hemoglobin of 10.1.  I ordered imaging studies including chest x-ray I independently visualized and interpreted imaging which showed normal. I agree with the radiologist interpretation  Cardiac Monitoring:  The patient was maintained  on a cardiac monitor.  I personally viewed and interpreted the cardiac monitored which showed an underlying rhythm of: NSR  Afebrile, hemodynamically stable.  42 year old female presenting to the ED for evaluation of chest pain.  States she has been having these chest pains for the past year.  Believes it is secondary to significant life stressors from work and poor diet.  She appears very well on physical exam.  She is in no acute distress.  She currently has no complaints.  EKG is without ischemic changes.  Chest x-ray normal.  Initial troponin normal.  Heart score of 1 and have very low suspicion for ACS as the cause of her symptoms.  She is also PERC negative and I have low suspicion for PE as the cause of her symptoms.  Ambulatory referral to cardiology was placed.  she was given return precautions.  Stable at discharge.  At this time there does not appear to be any evidence of an acute emergency medical condition and the patient appears stable for discharge with appropriate outpatient follow up. Diagnosis was discussed with patient who verbalizes understanding of care plan and is agreeable to discharge. I have discussed  return precautions with patient who verbalizes understanding. Patient encouraged to follow-up with cardiology. All questions answered.  Note: Portions of this report may have been transcribed using voice recognition software. Every effort was made to ensure accuracy; however, inadvertent computerized transcription errors may still be present.        Final Clinical Impression(s) / ED Diagnoses Final diagnoses:  Atypical chest pain    Rx / DC Orders ED Discharge Orders          Ordered    Ambulatory referral to Cardiology       Comments: If you have not heard from the Cardiology office within the next 72 hours please call [phone].   01/14/23 0948              Michelle Piper, PA-C 01/14/23 0981    Ernie Avena, MD 01/14/23 1157

## 2023-01-14 NOTE — Discharge Instructions (Signed)
You have been seen today for your complaint of chest pain. Your lab work was overall very reassuring. Your imaging was reassuring and showed no abnormality. Follow up with: Your primary care doctor.  You should also follow-up with cardiology.  I have placed an ambulatory referral to cardiology.  They should call you within the next week to schedule an appointment. Please seek immediate medical care if you develop any of the following symptoms: Your chest pain gets worse. You have a cough that gets worse, or you cough up blood. You have severe pain in your abdomen. You faint. You have sudden, unexplained chest discomfort. You have sudden, unexplained discomfort in your arms, back, neck, or jaw. You have shortness of breath at any time. You suddenly start to sweat, or your skin gets clammy. You feel nausea or you vomit. You suddenly feel lightheaded or dizzy. You have severe weakness, or unexplained weakness or fatigue. Your heart begins to beat quickly, or it feels like it is skipping beats. At this time there does not appear to be the presence of an emergent medical condition, however there is always the potential for conditions to change. Please read and follow the below instructions.  Do not take your medicine if  develop an itchy rash, swelling in your mouth or lips, or difficulty breathing; call 911 and seek immediate emergency medical attention if this occurs.  You may review your lab tests and imaging results in their entirety on your MyChart account.  Please discuss all results of fully with your primary care provider and other specialist at your follow-up visit.  Note: Portions of this text may have been transcribed using voice recognition software. Every effort was made to ensure accuracy; however, inadvertent computerized transcription errors may still be present.

## 2023-01-14 NOTE — ED Notes (Signed)
Discharge paperwork given and verbally understood. 

## 2024-01-26 DIAGNOSIS — H35352 Cystoid macular degeneration, left eye: Secondary | ICD-10-CM | POA: Diagnosis not present

## 2024-01-26 DIAGNOSIS — H0288B Meibomian gland dysfunction left eye, upper and lower eyelids: Secondary | ICD-10-CM | POA: Diagnosis not present

## 2024-01-26 DIAGNOSIS — H0288A Meibomian gland dysfunction right eye, upper and lower eyelids: Secondary | ICD-10-CM | POA: Diagnosis not present

## 2024-02-01 DIAGNOSIS — L239 Allergic contact dermatitis, unspecified cause: Secondary | ICD-10-CM | POA: Diagnosis not present

## 2024-02-01 DIAGNOSIS — L308 Other specified dermatitis: Secondary | ICD-10-CM | POA: Diagnosis not present

## 2024-02-01 DIAGNOSIS — L300 Nummular dermatitis: Secondary | ICD-10-CM | POA: Diagnosis not present

## 2024-02-01 DIAGNOSIS — L282 Other prurigo: Secondary | ICD-10-CM | POA: Diagnosis not present
